# Patient Record
Sex: Female | Born: 1995
Health system: Southern US, Community
[De-identification: ages and names within clinical notes are randomized; demographics above are authoritative.]

## PROBLEM LIST (undated history)

## (undated) ENCOUNTER — Ambulatory Visit: Disposition: A | Discharge status: Still In Hospital | Payer: Medicaid Other

## (undated) ENCOUNTER — Ambulatory Visit: Admission: EM | Payer: BC Managed Care – PPO | Source: Home / Self Care

## (undated) DIAGNOSIS — T7840XA Allergy, unspecified, initial encounter: Secondary | ICD-10-CM

## (undated) DIAGNOSIS — D249 Benign neoplasm of unspecified breast: Secondary | ICD-10-CM

## (undated) HISTORY — DX: Benign neoplasm of unspecified breast: D24.9

## (undated) HISTORY — DX: Allergy, unspecified, initial encounter: T78.40XA

---
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
## 2013-05-19 ENCOUNTER — Ambulatory Visit (INDEPENDENT_AMBULATORY_CARE_PROVIDER_SITE_OTHER): Payer: Commercial Managed Care - PPO | Admitting: Physician Assistant

## 2013-05-19 VITALS — BP 120/79 | HR 114 | Temp 98.9°F | Resp 16 | Ht 68.0 in | Wt 177.0 lb

## 2013-05-19 DIAGNOSIS — A059 Bacterial foodborne intoxication, unspecified: Secondary | ICD-10-CM

## 2013-05-19 NOTE — Progress Notes (Signed)
  Subjective:    Patient ID: Ann Schultz, female    DOB: [birth year], 17 y.o.   MRN: [id number]  HPI    Ann Schultz is a pleasant 17 yr old female here because "I think I have food poisoning."  Yesterday pt ate at [facility] with her family.  On the way home felt very nauseous and subsequently vomited.  Tried eating bread and drinking ginger ale but could not keep anything down.  Last episode of vomiting was about 18 hours ago.  Has had two episodes of diarrhea, last about 12 hours ago.  Today has been able to tolerated saltine crackers, water, and ginger ale.  Feeling better than she was, but does feel achy.  Has some low back pain - denies urinary symptoms.  No abdominal pain.  No fever - but has felt hot and cold.  Pt is not currently nauseated.  She has been hydrating and able to urinate.  4 of 5 family members are also ill with similar symptoms.     Review of Systems  Constitutional: Negative for fever and chills.  Respiratory: Negative.   Cardiovascular: Negative.   Gastrointestinal: Positive for nausea, vomiting and diarrhea. Negative for abdominal pain.  Genitourinary: Negative for dysuria, urgency, frequency and flank pain.  Musculoskeletal: Positive for back pain.  Skin: Negative.   Neurological: Positive for headaches.       Objective:   Physical Exam  Vitals reviewed. Constitutional: She is oriented to person, place, and time. She appears well-developed and well-nourished. No distress.  HENT:  Head: Normocephalic and atraumatic.  Mouth/Throat: Mucous membranes are normal.  Eyes: Conjunctivae are normal. No scleral icterus.  Cardiovascular: Normal rate, regular rhythm and normal heart sounds.  Exam reveals no gallop and no friction rub.   No murmur heard. Pulmonary/Chest: Effort normal and breath sounds normal. She has no wheezes. She has no rales.  Abdominal: Soft. Bowel sounds are decreased. There is no tenderness. There is no rigidity, no rebound, no guarding, no CVA  tenderness, no tenderness at McBurney's point and negative Murphy's sign.  Neurological: She is alert and oriented to person, place, and time.  Skin: Skin is warm and dry.  Psychiatric: She has a normal mood and affect. Her behavior is normal.          Assessment & Plan:  Food poisoning   Ann Schultz is an 17 yr old female here with suspected food poisoning.  Pt experienced nausea, vomiting, diarrhea after eating at [facility].  Several family members ill with similar symptoms after eating at same restaurant.  Afebrile, VSS, no abd pain.  Pt feels like she is improving, no vomiting or diarrhea for 12+ hours.  Encouraged oral rehydration.  Advance diet as tolerated.  May use Tylenol/Motrin for aches.  Discussed RTC/ED precautions including fever, abd pain, worsening symptoms.  Pt understands and is in agreement with this plan.  School note provided for today and tomorrow.

## 2013-05-19 NOTE — Patient Instructions (Addendum)
Food Poisoning °Food poisoning is an illness caused by something you ate or drank. There are over 250 known causes of food poisoning. However, many other causes are unknown. You can be treated even if the exact cause of your food poisoning is not known. In most cases, food poisoning is mild and lasts 1 to 2 days. However, some cases can be serious, especially for people with low immune systems, the elderly, children and infants, and pregnant women. °CAUSES  °Poor personal hygiene, improper cleaning of storage and preparation areas, and unclean utensils can cause infection or tainting (contamination) of foods. The causes of food poisoning are numerous. Infectious agents, such as viruses, bacteria, or parasites, can cause harm by infecting the intestine and disrupting the absorption of nutrients and water. This can cause diarrhea and lead to dehydration. Viruses are responsible for most of the food poisonings in which an agent is found. Parasites are less likely to cause food poisoning. Toxic agents, such as poisonous mushrooms, marine algae, and pesticides can also cause food poisoning. °· Viral causes of food poisoning include: °· Norovirus. °· Rotavirus. °· Hepatitis A. °· Bacterial causes of food poisoning include: °· Salmonellae. °· Campylobacter. °· Bacillus cereus. °· Escherichia coli (E. coli). °· Shigella. °· Listeria monocytogenes. °· Clostridium botulinum (botulism). °· Vibrio cholerae. °· Parasites that can cause food poisoning include: °· Giardia. °· Cryptosporidium. °· Toxoplasma. °SYMPTOMS °Symptoms may appear several hours or longer after consuming the contaminated food or drink. Symptoms may include: °· Nausea. °· Vomiting. °· Cramping. °· Diarrhea. °· Fever and chills. °· Muscle aches. °DIAGNOSIS °Your caregiver may be able to diagnose food poisoning from a list of what you have recently eaten and results from lab tests. Diagnostic tests may include an exam of the feces. °TREATMENT °In most cases,  treatment focuses on helping to relieve your symptoms and staying well hydrated. Antibiotics are rarely needed. In severe cases, hospitalization may be required. °PREVENTION  °· Wash your hands, food preparation surfaces, and utensils thoroughly before and after handling raw foods. °· Keep refrigerated foods below 40° F (5° C). °· Serve hot foods immediately or keep them heated above 140° F (60° C). °· Divide large volumes of food into small portions for rapid cooling in the refrigerator. Hot, bulky foods in the refrigerator can raise the temperature of other foods that have already cooled. °· Follow approved canning procedures. °· Heat canned foods thoroughly before tasting. °· When in doubt, throw it out. °· Infants, the elderly, women who are pregnant, and people with compromised immune systems are especially susceptible to food poisoning. These people should never consume unpasteurized cheese, unpasteurized cider, raw fish, raw seafood, or raw meat type products. °HOME CARE INSTRUCTIONS  °· Drink enough water and fluids to keep your urine clear or pale yellow. Drink small amounts of fluids frequently and increase as tolerated. °· Ask your caregiver for specific rehydration instructions. °· Avoid: °· Foods high in sugar. °· Alcohol. °· Carbonated drinks. °· Tobacco. °· Juice. °· Caffeine drinks. °· Extremely hot or cold fluids. °· Fatty, greasy foods. °· Too much intake of anything at one time. °· Dairy products until 24 to 48 hours after diarrhea stops. °· You may consume probiotics. Probiotics are active cultures of beneficial bacteria. They may lessen the amount and number of diarrheal stools in adults. Probiotics can be found in yogurt with active cultures and in supplements. °· Wash your hands well to avoid spreading the bacteria. °· Only take over-the-counter or prescription medicines for pain, discomfort,   or fever as directed by your caregiver. Do not give aspirin to children. °· Ask your caregiver if you  should continue to take your regular prescribed and over-the-counter medicines. °SEEK IMMEDIATE MEDICAL CARE IF:  °· You have difficulty breathing, swallowing, talking, or moving. °· You develop blurred vision. °· You are unable to keep fluids down. °· You faint or nearly faint. °· Your eyes turn yellow. °· Vomiting or diarrhea develops or becomes persistent. °· Abdominal pain develops, increases, or localizes in one small area. °· You have a fever. °· The diarrhea becomes excessive or contains blood or mucus. °· You develop excessive weakness, dizziness, or extreme thirst. °· You have no urine for 8 hours. °MAKE SURE YOU:  °· Understand these instructions. °· Will watch your condition. °· Will get help right away if you are not doing well or get worse. °Document Released: 09/12/2004 Document Revised: 03/08/2012 Document Reviewed: 05/01/2011 °[employer]® Patient Information ©2014 [employer]. ° °

## 2014-09-17 ENCOUNTER — Ambulatory Visit (INDEPENDENT_AMBULATORY_CARE_PROVIDER_SITE_OTHER): Payer: BC Managed Care – PPO | Admitting: Family Medicine

## 2014-09-17 VITALS — BP 104/58 | HR 119 | Temp 98.9°F | Resp 18 | Ht 66.75 in | Wt 188.8 lb

## 2014-09-17 DIAGNOSIS — R109 Unspecified abdominal pain: Secondary | ICD-10-CM

## 2014-09-17 DIAGNOSIS — R300 Dysuria: Secondary | ICD-10-CM

## 2014-09-17 DIAGNOSIS — R350 Frequency of micturition: Secondary | ICD-10-CM

## 2014-09-17 LAB — POCT URINALYSIS DIPSTICK
Bilirubin, UA: NEGATIVE
KETONES UA: NEGATIVE
Nitrite, UA: POSITIVE
PROTEIN UA: 100
SPEC GRAV UA: 1.015
Urobilinogen, UA: 0.2
pH, UA: 6

## 2014-09-17 LAB — POCT UA - MICROSCOPIC ONLY
CRYSTALS, UR, HPF, POC: NEGATIVE
MUCUS UA: NEGATIVE
Yeast, UA: NEGATIVE

## 2014-09-17 MED ORDER — CIPROFLOXACIN HCL 500 MG PO TABS: 500.0000 mg | ORAL_TABLET | Freq: Two times a day (BID) | ORAL | Status: DC

## 2014-09-17 NOTE — Progress Notes (Signed)
     Results for orders placed in visit on 09/17/14  POCT UA - MICROSCOPIC ONLY      Result Value Ref Range   WBC, Ur, HPF, POC tntc     RBC, urine, microscopic 0-3     Bacteria, U Microscopic small     Mucus, UA neg     Epithelial cells, urine per micros 10-15     Crystals, Ur, HPF, POC neg     Casts, Ur, LPF, POC renal tubular     Yeast, UA neg    POCT URINALYSIS DIPSTICK      Result Value Ref Range   Color, UA yellow     Clarity, UA cloudy     Glucose, UA nbeg     Bilirubin, UA neg     Ketones, UA neg     Spec Grav, UA 1.015     Blood, UA large     pH, UA 6.0     Protein, UA 100     Urobilinogen, UA 0.2     Nitrite, UA positive     Leukocytes, UA large (3+)

## 2014-09-17 NOTE — Patient Instructions (Signed)

## 2014-09-17 NOTE — Progress Notes (Signed)
This chart was scribed for Dr. Robyn Haber, MD by Erling Conte, Medical Scribe. This patient was seen in Room 9 and the patient's care was started at 11:33 AM.   Patient ID: Ann Schultz MRN: [id number], DOB: 07-05-1996, 18 y.o. Date of Encounter: 09/17/2014, 11:32 AM  Primary Physician: Tawanna Solo, MD  Chief Complaint:  Chief Complaint  Patient presents with  . Chills    x 3-5 days  . Generalized Body Aches  . cloudy urine  . Dysuria  . Flank Pain    left side    HPI: 18 y.o. year old female presents with 3-5 day history of dysuria, myalgias, urgency, foul smelling urine, frequency and not voiding a lot of urine at one time. Pt states that her last UTI was several years ago, while she was in high school. Pt has not taken anything for her symptoms. Nothing seems to make her symptoms better. She denies any vaginal discharge, back pain, fever, nausea, hematuria, or emesis  Pt is a Ship broker, Civil engineer, contracting   Past Medical History  Diagnosis Date  . Breast fibroadenoma     right  . Allergy      Home Meds: Prior to Admission medications   Medication Sig Start Date End Date Taking? Authorizing Provider  ciprofloxacin (CIPRO) 500 MG tablet Take 1 tablet (500 mg total) by mouth 2 (two) times daily. 09/17/14   Robyn Haber, MD  medroxyPROGESTERone (DEPO-PROVERA) 150 MG/ML injection Inject 1 mL (150 mg total) into the muscle once. 08/05/12   Damien Fusi, NP    Allergies: No Known Allergies  History   Social History  . Marital Status: Single    Spouse Name: N/A    Number of Children: N/A  . Years of Education: N/A   Occupational History  . Not on file.   Social History Main Topics  . Smoking status: Never Smoker   . Smokeless tobacco: Not on file  . Alcohol Use: No  . Drug Use: No  . Sexual Activity: Yes   Other Topics Concern  . Not on file   Social History Narrative  . No narrative on file     Review of Systems: Constitutional:  negative for chills, fever, night sweats or weight changes Cardiovascular: negative for chest pain or palpitations Respiratory: negative for hemoptysis, wheezing, or shortness of breath Abdominal: negative for abdominal pain, nausea, vomiting or diarrhea Dermatological: negative for rash Neurologic: negative for headache GU: positive for flank pain, dysuria, frequency and decrease in urine output. Negative for vaginal discharge or hematuria Musculoskeletal: positive for myalgias A complete 10 system review of systems was obtained and all systems are negative except as noted   Physical Exam: Blood pressure 104/58, pulse 119, temperature 98.9 F (37.2 C), temperature source Oral, resp. rate 18, height 5' 6.75" (1.695 m), weight 188 lb 12.8 oz (85.639 kg), last menstrual period 08/17/2014, SpO2 100.00%., Body mass index is 29.81 kg/(m^2). General: Well developed, well nourished, in no acute distress. Head: Normocephalic, atraumatic, eyes without discharge, sclera non-icteric, nares are congested. Bilateral auditory canals clear, TM's are without perforation, pearly grey with reflective cone of light bilaterally. Serous effusion bilaterally behind TM's. Maxillary sinus TTP. Oral cavity moist, dentition normal. Posterior pharynx with post nasal drip and mild erythema. No peritonsillar abscess or tonsillar exudate. Neck: Supple. No thyromegaly. Full ROM. No lymphadenopathy. Lungs: Coarse breath sounds bilaterally without Clear bilaterally to auscultation without wheezes, rales, or rhonchi. Breathing is unlabored.  Heart: RRR with S1 S2.  No murmurs, rubs, or gallops appreciated. Abdomen: Soft, non-tender, non-distended with normoactive bowel sounds. No hepatosplenomegaly. No rebound/guarding. No obvious abdominal masses. McBurney's, Rovsing's, Iliopsoas, and table jar all negative. Msk:  Strength and tone normal for age. Extremities: No clubbing or cyanosis. No edema. Neuro: Alert and oriented X 3.  Moves all extremities spontaneously. CNII-XII grossly in tact. Psych:  Responds to questions appropriately with a normal affect.   Labs: Results for orders placed in visit on 09/17/14  POCT UA - MICROSCOPIC ONLY      Result Value Ref Range   WBC, Ur, HPF, POC tntc     RBC, urine, microscopic 0-3     Bacteria, U Microscopic small     Mucus, UA neg     Epithelial cells, urine per micros 10-15     Crystals, Ur, HPF, POC neg     Casts, Ur, LPF, POC renal tubular     Yeast, UA neg    POCT URINALYSIS DIPSTICK      Result Value Ref Range   Color, UA yellow     Clarity, UA cloudy     Glucose, UA nbeg     Bilirubin, UA neg     Ketones, UA neg     Spec Grav, UA 1.015     Blood, UA large     pH, UA 6.0     Protein, UA 100     Urobilinogen, UA 0.2     Nitrite, UA positive     Leukocytes, UA large (3+)       ASSESSMENT AND PLAN:  18 y.o. year old female with  1. Dysuria   2. Flank pain   3. Urinary frequency    Will prescribe pt with Cipro  Signed, Robyn Haber, MD 09/17/2014 11:32 AM

## 2014-09-18 ENCOUNTER — Telehealth: Payer: Self-pay

## 2014-09-18 NOTE — Telephone Encounter (Signed)
Patient's mother states that patient was supposed to return to school today however patient still does not feel well and was out of school today. She will also miss school tomorrow. Can the patient have an updated note excusing her for today and tomorrow? Also, wants to know what her daughter can take to help her feel better  Mahira Petras (mother) [phone]

## 2014-09-19 NOTE — Telephone Encounter (Signed)
LM for rtn call  Lab results are in- urine culture grew bacteria. Be sure pt is finishing abx.  Pt may use OTC AZO for pain relief. Ok to extend OOS note. Letter is written- needs to be printed.

## 2014-09-19 NOTE — Telephone Encounter (Signed)
Advised to make sure pt finishes antibiotics, advised they can purchase over the counter azo for pain and also that the out of school note was able to be extended until 09/21/14. Patient will pick up letter in the morning

## 2014-09-20 LAB — URINE CULTURE: Colony Count: >=100000

## 2014-11-09 ENCOUNTER — Ambulatory Visit: Payer: BC Managed Care – PPO

## 2015-04-06 ENCOUNTER — Ambulatory Visit (INDEPENDENT_AMBULATORY_CARE_PROVIDER_SITE_OTHER): Payer: 59 | Admitting: Family Medicine

## 2015-04-06 VITALS — BP 110/68 | HR 77 | Temp 98.3°F | Resp 18 | Ht 69.0 in | Wt 179.0 lb

## 2015-04-06 DIAGNOSIS — S0502XA Injury of conjunctiva and corneal abrasion without foreign body, left eye, initial encounter: Secondary | ICD-10-CM | POA: Diagnosis not present

## 2015-04-06 MED ORDER — ERYTHROMYCIN 5 MG/GM OP OINT: TOPICAL_OINTMENT | OPHTHALMIC | Status: DC

## 2015-04-06 NOTE — Patient Instructions (Signed)
Use the antibiotic ointment in your left eye at least 4x (up to 6x) a day until sx resolve.  If your eye is not improved tomorrow please come back in- in that case we may need to send you to an eye doctor.   You can also use ibuprofen as needed for pain

## 2015-04-06 NOTE — Progress Notes (Signed)
[facility] [address] [place] [address] [phone]  Date:  04/06/2015   Name:  Ann Schultz   DOB:  07-21-1996   MRN:  [id number]  PCP:  Tawanna Solo, MD    Chief Complaint: Eye Pain and Headache   History of Present Illness:  Ann Schultz is a 19 y.o. very pleasant female patient who presents with the following:  Yesterday she noted pain in the lateral part of her left eye. She used a cold compress and felt better, but then the pain came back last night She does not have any crusting or stickiness of her eye.  Hast not noted any discharge Her vision seems to be ok, but the eye is still painful today No photophobia She wears readers but no rx lenses, no contacts used.   No ST, no cough, no fever She is not aware of any trauma to the eye.   She is otherwise healthy and feeling well today  Patient Active Problem List   Diagnosis Date Noted  . Breast mass, right, probable fibroadenoma 11/10/2011    Past Medical History  Diagnosis Date  . Breast fibroadenoma     right  . Allergy     History reviewed. No pertinent past surgical history.  History  Substance Use Topics  . Smoking status: Never Smoker   . Smokeless tobacco: Not on file  . Alcohol Use: No    Family History  Problem Relation Age of Onset  . Cancer Maternal Grandmother     breast    No Known Allergies  Medication list has been reviewed and updated.  Current Outpatient Prescriptions on File Prior to Visit  Medication Sig Dispense Refill  . ciprofloxacin (CIPRO) 500 MG tablet Take 1 tablet (500 mg total) by mouth 2 (two) times daily. (Patient not taking: Reported on 04/06/2015) 6 tablet 0  . medroxyPROGESTERone (DEPO-PROVERA) 150 MG/ML injection Inject 1 mL (150 mg total) into the muscle once. (Patient not taking: Reported on 04/06/2015) 1 mL 4   No current facility-administered medications on file prior to visit.    Review of Systems:  As per HPI- otherwise  negative.   Physical Examination: Filed Vitals:   04/06/15 1056  BP: 110/68  Pulse: 77  Temp: 98.3 F (36.8 C)  Resp: 18   Filed Vitals:   04/06/15 1056  Height: 5\' 9"  (1.753 m)  Weight: 179 lb (81.194 kg)   Body mass index is 26.42 kg/(m^2). Ideal Body Weight: Weight in (lb) to have BMI = 25: 168.9  GEN: WDWN, NAD, Non-toxic, A & O x 3, mild overweight, looks well HEENT: Atraumatic, Normocephalic. Neck supple. No masses, No LAD.  Bilateral TM wnl, oropharynx normal.  PEERL,EOMI.   Ears and Nose: No external deformity. CV: RRR, No M/G/R. No JVD. No thrill. No extra heart sounds. PULM: CTA B, no wheezes, crackles, rhonchi. No retractions. No resp. distress. No accessory muscle use. ABD: S, NT, ND, +BS. No rebound. No HSM. EXTR: No c/c/e NEURO Normal gait.  PSYCH: Normally interactive. Conversant. Not depressed or anxious appearing.  Calm demeanor.  There is injection of the lateral left cornea. Normal eye motion, fundoscopic exam wnl Fluorescin stain: irritation but no acute ulcer of the temporal aspect of the left cornea No FB. No stye noted  Assessment and Plan: Corneal abrasion, left, initial encounter - Plan: erythromycin ophthalmic ointment  Discussed in detail with pt.  Offered to refer to ortho today vs trial of abx  ointment and recheck tomorrow if not better  She would prefer trial of treatment and will follow-up if not better Signed Lamar Blinks, MD

## 2015-04-12 ENCOUNTER — Ambulatory Visit (INDEPENDENT_AMBULATORY_CARE_PROVIDER_SITE_OTHER): Payer: 59 | Admitting: Family Medicine

## 2015-04-12 VITALS — BP 124/60 | HR 98 | Temp 100.0°F | Ht 67.0 in | Wt 177.4 lb

## 2015-04-12 DIAGNOSIS — R5382 Chronic fatigue, unspecified: Secondary | ICD-10-CM | POA: Diagnosis not present

## 2015-04-12 DIAGNOSIS — R6889 Other general symptoms and signs: Secondary | ICD-10-CM | POA: Diagnosis not present

## 2015-04-12 DIAGNOSIS — D649 Anemia, unspecified: Secondary | ICD-10-CM

## 2015-04-12 LAB — POCT CBC
GRANULOCYTE PERCENT: 60.4 % (ref 37–80)
HEMATOCRIT: 35.1 % — AB (ref 37.7–47.9)
Hemoglobin: 10.8 g/dL — AB (ref 12.2–16.2)
LYMPH, POC: 1.7 (ref 0.6–3.4)
MCH, POC: 23.1 pg — AB (ref 27–31.2)
MCHC: 30.8 g/dL — AB (ref 31.8–35.4)
MCV: 75.2 fL — AB (ref 80–97)
MID (cbc): 0.7 (ref 0–0.9)
MPV: 8.5 fL (ref 0–99.8)
PLATELET COUNT, POC: 254 10*3/uL (ref 142–424)
POC GRANULOCYTE: 3.6 (ref 2–6.9)
POC LYMPH %: 28.6 % (ref 10–50)
POC MID %: 11 % (ref 0–12)
RBC: 4.67 M/uL (ref 4.04–5.48)
RDW, POC: 16.7 %
WBC: 6 10*3/uL (ref 4.6–10.2)

## 2015-04-12 LAB — POCT INFLUENZA A/B
Influenza A, POC: NEGATIVE
Influenza B, POC: POSITIVE

## 2015-04-12 MED ORDER — OSELTAMIVIR PHOSPHATE 75 MG PO CAPS: 75.0000 mg | ORAL_CAPSULE | Freq: Two times a day (BID) | ORAL | Status: DC

## 2015-04-12 MED ORDER — FERROUS SULFATE 325 (65 FE) MG PO TABS: 325.0000 mg | ORAL_TABLET | Freq: Every day | ORAL | Status: DC

## 2015-04-12 NOTE — Progress Notes (Addendum)
Ann Schultz is a 19 y.o. female who presents to Urgent Care today with complaints of chills, URI sx's:  1.  URI: Son diagnosed with swab + flu last Wednesday.  Treated with Tamiflu.  Started recovering about 2 days ago.  She began having issues about 2 days ago.  Alternating fevers and chills at home.  With rhinorrhea, cough non-productive.  Nausea and anorexia.  Malaise.  Received flu shot in August.   2.  Chronic fatigue:   Chronic for about 2 years.  Had Hgb 7.3 after she gave birth to her son, never rechedcked.  Started on about 6 months of daily iron without recheck.  States she is chronically chilled and fatigued.  No LE edema.  No family history of thyroid issues.  History of menorrhagia x 2 years or so.  Even when on OCPs.  PMH reviewed.  Past Medical History  Diagnosis Date  . Breast fibroadenoma     right  . Allergy    History reviewed. No pertinent past surgical history.  Medications reviewed. Current Outpatient Prescriptions  Medication Sig Dispense Refill  . erythromycin ophthalmic ointment Apply a 1cm ribbon to lower lid of left eye up to 6x a day for one week 3.5 g 0   No current facility-administered medications for this visit.    ROS as above otherwise neg.    Physical Exam:  BP 124/60 mmHg  Pulse 98  Temp(Src) 100 F (37.8 C) (Oral)  Ht 5\' 7"  (1.702 m)  Wt 177 lb 6 oz (80.457 kg)  BMI 27.77 kg/m2  SpO2 100%  LMP 04/02/2015 Gen:  Patient sitting on exam table, appears stated age in no acute distress Head: Normocephalic atraumatic Eyes: EOMI, PERRL, sclera and conjunctiva non-erythematous.  Some mild pallor noted.  Ears:  Canals clear bilaterally.  TMs pearly gray bilaterally without erythema or bulging.   Nose:  Nasal turbinates grossly enlarged bilaterally. Some exudates noted, clear. Neck: No cervical lymphadenopathy noted Heart:  RRR, no murmurs auscultated. Pulm:  Clear to auscultation bilaterally with good air movement.  No wheezes or rales noted.     Results for orders placed or performed in visit on 04/12/15  POCT CBC  Result Value Ref Range   WBC 6.0 4.6 - 10.2 K/uL   Lymph, poc 1.7 0.6 - 3.4   POC LYMPH PERCENT 28.6 10 - 50 %L   MID (cbc) 0.7 0 - 0.9   POC MID % 11.0 0 - 12 %M   POC Granulocyte 3.6 2 - 6.9   Granulocyte percent 60.4 37 - 80 %G   RBC 4.67 4.04 - 5.48 M/uL   Hemoglobin 10.8 (A) 12.2 - 16.2 g/dL   HCT, POC 35.1 (A) 37.7 - 47.9 %   MCV 75.2 (A) 80 - 97 fL   MCH, POC 23.1 (A) 27 - 31.2 pg   MCHC 30.8 (A) 31.8 - 35.4 g/dL   RDW, POC 16.7 %   Platelet Count, POC 254 142 - 424 K/uL   MPV 8.5 0 - 99.8 fL  POCT Influenza A/B  Result Value Ref Range   Influenza A, POC Negative    Influenza B, POC Positive      Assessment and Plan: 1.  Influenza: - Tamiflu to treat.  She is within the window - out of work for tomorrow and Saturday. - to return if no improvement by Monday, sooner if worsening  2.  Anemia: - likely iron deficiency from chronic menorrhagia. - FU with gyn.  -  Oral iron supplementation.

## 2015-04-12 NOTE — Patient Instructions (Signed)
Take the Tamiflu x 5 days, twice a day.  Start taking the iron pills once a day.  If you can tolerate twice a day, this is ideal.  Come back on Monday if you're not doing any better.  Come back sooner if you're worsening.

## 2015-04-20 ENCOUNTER — Other Ambulatory Visit: Payer: Self-pay | Admitting: Obstetrics and Gynecology

## 2015-04-20 DIAGNOSIS — IMO0002 Reserved for concepts with insufficient information to code with codable children: Secondary | ICD-10-CM

## 2015-04-20 DIAGNOSIS — R229 Localized swelling, mass and lump, unspecified: Principal | ICD-10-CM

## 2015-04-23 ENCOUNTER — Ambulatory Visit
Admission: RE | Admit: 2015-04-23 | Discharge: 2015-04-23 | Disposition: A | Discharge status: Home / Self Care | Payer: 59 | Source: Ambulatory Visit | Attending: Obstetrics and Gynecology | Admitting: Obstetrics and Gynecology

## 2015-04-23 DIAGNOSIS — IMO0002 Reserved for concepts with insufficient information to code with codable children: Secondary | ICD-10-CM

## 2015-04-23 DIAGNOSIS — R229 Localized swelling, mass and lump, unspecified: Principal | ICD-10-CM

## 2015-05-29 ENCOUNTER — Encounter (HOSPITAL_COMMUNITY): Payer: Self-pay | Admitting: *Deleted

## 2015-05-29 ENCOUNTER — Other Ambulatory Visit: Payer: Self-pay | Admitting: General Surgery

## 2015-05-31 ENCOUNTER — Ambulatory Visit (HOSPITAL_COMMUNITY)
Admission: RE | Admit: 2015-05-31 | Discharge: 2015-05-31 | Disposition: A | Discharge status: Home / Self Care | Payer: 59 | Source: Ambulatory Visit | Attending: General Surgery | Admitting: General Surgery

## 2015-05-31 ENCOUNTER — Ambulatory Visit (HOSPITAL_COMMUNITY): Payer: 59 | Admitting: Anesthesiology

## 2015-05-31 ENCOUNTER — Encounter (HOSPITAL_COMMUNITY): Payer: Self-pay | Admitting: General Surgery

## 2015-05-31 ENCOUNTER — Encounter (HOSPITAL_COMMUNITY)
Admission: RE | Disposition: A | Discharge status: Home / Self Care | Payer: Self-pay | Source: Ambulatory Visit | Attending: General Surgery

## 2015-05-31 DIAGNOSIS — Z803 Family history of malignant neoplasm of breast: Secondary | ICD-10-CM | POA: Diagnosis not present

## 2015-05-31 DIAGNOSIS — N63 Unspecified lump in breast: Secondary | ICD-10-CM | POA: Diagnosis present

## 2015-05-31 DIAGNOSIS — D242 Benign neoplasm of left breast: Secondary | ICD-10-CM | POA: Insufficient documentation

## 2015-05-31 HISTORY — PX: EXCISION OF BREAST BIOPSY: SHX5822

## 2015-05-31 LAB — CBC
HCT: 30.1 % — ABNORMAL LOW (ref 36.0–46.0)
Hemoglobin: 9.6 g/dL — ABNORMAL LOW (ref 12.0–15.0)
MCH: 23.1 pg — AB (ref 26.0–34.0)
MCHC: 31.9 g/dL (ref 30.0–36.0)
MCV: 72.5 fL — AB (ref 78.0–100.0)
Platelets: 308 10*3/uL (ref 150–400)
RBC: 4.15 MIL/uL (ref 3.87–5.11)
RDW: 16.2 % — AB (ref 11.5–15.5)
WBC: 5.9 10*3/uL (ref 4.0–10.5)

## 2015-05-31 LAB — HCG, SERUM, QUALITATIVE: Preg, Serum: NEGATIVE

## 2015-05-31 SURGERY — EXCISION OF BREAST BIOPSY
Anesthesia: General | Site: Breast | Laterality: Left

## 2015-05-31 MED ORDER — 0.9 % SODIUM CHLORIDE (POUR BTL) OPTIME
TOPICAL | Status: DC | PRN
  Administered 2015-05-31: 1000 mL

## 2015-05-31 MED ORDER — ACETAMINOPHEN 650 MG RE SUPP
650.0000 mg | RECTAL | Status: DC | PRN
  Filled 2015-05-31: qty 1

## 2015-05-31 MED ORDER — FENTANYL CITRATE (PF) 250 MCG/5ML IJ SOLN
INTRAMUSCULAR | Status: AC
  Filled 2015-05-31: qty 5

## 2015-05-31 MED ORDER — SODIUM CHLORIDE 0.9 % IJ SOLN: 3.0000 mL | Freq: Two times a day (BID) | INTRAMUSCULAR | Status: DC

## 2015-05-31 MED ORDER — PROMETHAZINE HCL 25 MG/ML IJ SOLN
6.2500 mg | INTRAMUSCULAR | Status: DC | PRN
Start: 2015-05-31 — End: 2015-05-31

## 2015-05-31 MED ORDER — MIDAZOLAM HCL 2 MG/2ML IJ SOLN
INTRAMUSCULAR | Status: AC
  Filled 2015-05-31: qty 2

## 2015-05-31 MED ORDER — FENTANYL CITRATE (PF) 100 MCG/2ML IJ SOLN
INTRAMUSCULAR | Status: AC
  Filled 2015-05-31: qty 2

## 2015-05-31 MED ORDER — ONDANSETRON HCL 4 MG/2ML IJ SOLN
INTRAMUSCULAR | Status: DC | PRN
  Administered 2015-05-31: 4 mg via INTRAVENOUS

## 2015-05-31 MED ORDER — BUPIVACAINE-EPINEPHRINE 0.25% -1:200000 IJ SOLN
INTRAMUSCULAR | Status: DC | PRN
  Administered 2015-05-31: 10 mL

## 2015-05-31 MED ORDER — DEXAMETHASONE SODIUM PHOSPHATE 10 MG/ML IJ SOLN
INTRAMUSCULAR | Status: AC
  Filled 2015-05-31: qty 1

## 2015-05-31 MED ORDER — MIDAZOLAM HCL 5 MG/5ML IJ SOLN
INTRAMUSCULAR | Status: DC | PRN
  Administered 2015-05-31: 2 mg via INTRAVENOUS

## 2015-05-31 MED ORDER — FENTANYL CITRATE (PF) 100 MCG/2ML IJ SOLN
INTRAMUSCULAR | Status: DC | PRN
  Administered 2015-05-31 (×3): 25 ug via INTRAVENOUS

## 2015-05-31 MED ORDER — ACETAMINOPHEN 10 MG/ML IV SOLN
1000.0000 mg | Freq: Once | INTRAVENOUS | Status: DC
  Filled 2015-05-31: qty 100

## 2015-05-31 MED ORDER — LIDOCAINE HCL (CARDIAC) 10 MG/ML IV SOLN
INTRAVENOUS | Status: DC | PRN
  Administered 2015-05-31: 80 mg via INTRAVENOUS

## 2015-05-31 MED ORDER — LACTATED RINGERS IV SOLN
INTRAVENOUS | Status: DC
  Administered 2015-05-31: 1000 mL via INTRAVENOUS

## 2015-05-31 MED ORDER — BUPIVACAINE-EPINEPHRINE (PF) 0.25% -1:200000 IJ SOLN
INTRAMUSCULAR | Status: AC
  Filled 2015-05-31: qty 30

## 2015-05-31 MED ORDER — CEFAZOLIN SODIUM-DEXTROSE 2-3 GM-% IV SOLR
2.0000 g | INTRAVENOUS | Status: AC
  Administered 2015-05-31: 2 g via INTRAVENOUS

## 2015-05-31 MED ORDER — OXYCODONE-ACETAMINOPHEN 5-325 MG PO TABS: 1.0000 | ORAL_TABLET | ORAL | Status: DC | PRN

## 2015-05-31 MED ORDER — LIDOCAINE HCL 1 % IJ SOLN
INTRAMUSCULAR | Status: AC
  Filled 2015-05-31: qty 20

## 2015-05-31 MED ORDER — SODIUM CHLORIDE 0.9 % IV SOLN: 250.0000 mL | INTRAVENOUS | Status: DC | PRN

## 2015-05-31 MED ORDER — OXYCODONE HCL 5 MG PO TABS: 5.0000 mg | ORAL_TABLET | ORAL | Status: DC | PRN

## 2015-05-31 MED ORDER — FENTANYL CITRATE (PF) 100 MCG/2ML IJ SOLN
25.0000 ug | INTRAMUSCULAR | Status: DC | PRN
  Administered 2015-05-31 (×2): 25 ug via INTRAVENOUS

## 2015-05-31 MED ORDER — PROPOFOL 10 MG/ML IV BOLUS
INTRAVENOUS | Status: DC | PRN
  Administered 2015-05-31: 200 mg via INTRAVENOUS

## 2015-05-31 MED ORDER — PROPOFOL 10 MG/ML IV BOLUS
INTRAVENOUS | Status: AC
  Filled 2015-05-31: qty 20

## 2015-05-31 MED ORDER — SODIUM CHLORIDE 0.9 % IJ SOLN: 3.0000 mL | INTRAMUSCULAR | Status: DC | PRN

## 2015-05-31 MED ORDER — DEXAMETHASONE SODIUM PHOSPHATE 4 MG/ML IJ SOLN
INTRAMUSCULAR | Status: DC | PRN
  Administered 2015-05-31: 10 mg via INTRAVENOUS

## 2015-05-31 MED ORDER — LIDOCAINE HCL (PF) 1 % IJ SOLN
INTRAMUSCULAR | Status: DC | PRN
  Administered 2015-05-31: 10 mL

## 2015-05-31 MED ORDER — CEFAZOLIN SODIUM-DEXTROSE 2-3 GM-% IV SOLR
INTRAVENOUS | Status: AC
  Filled 2015-05-31: qty 50

## 2015-05-31 MED ORDER — LIDOCAINE HCL (CARDIAC) 20 MG/ML IV SOLN
INTRAVENOUS | Status: AC
  Filled 2015-05-31: qty 5

## 2015-05-31 MED ORDER — ONDANSETRON HCL 4 MG/2ML IJ SOLN
INTRAMUSCULAR | Status: AC
  Filled 2015-05-31: qty 2

## 2015-05-31 MED ORDER — ACETAMINOPHEN 325 MG PO TABS: 650.0000 mg | ORAL_TABLET | ORAL | Status: DC | PRN

## 2015-05-31 MED ORDER — ACETAMINOPHEN 10 MG/ML IV SOLN
INTRAVENOUS | Status: DC | PRN
  Administered 2015-05-31: 1000 mg via INTRAVENOUS

## 2015-05-31 SURGICAL SUPPLY — 39 items
ADH SKN CLS APL DERMABOND .7 (GAUZE/BANDAGES/DRESSINGS) ×1
BINDER BREAST LRG (GAUZE/BANDAGES/DRESSINGS) ×1 IMPLANT
BINDER BREAST XLRG (GAUZE/BANDAGES/DRESSINGS) IMPLANT
BLADE SURG SZ10 CARB STEEL (BLADE) ×2 IMPLANT
CHLORAPREP W/TINT 26ML (MISCELLANEOUS) ×2 IMPLANT
DECANTER SPIKE VIAL GLASS SM (MISCELLANEOUS) ×2 IMPLANT
DERMABOND ADVANCED (GAUZE/BANDAGES/DRESSINGS) ×1
DERMABOND ADVANCED .7 DNX12 (GAUZE/BANDAGES/DRESSINGS) IMPLANT
DRAIN CHANNEL 19F RND (DRAIN) IMPLANT
DRAPE UTILITY XL STRL (DRAPES) ×2 IMPLANT
DRSG PAD ABDOMINAL 8X10 ST (GAUZE/BANDAGES/DRESSINGS) ×1 IMPLANT
ELECT REM PT RETURN 9FT ADLT (ELECTROSURGICAL) ×2
ELECTRODE REM PT RTRN 9FT ADLT (ELECTROSURGICAL) ×1 IMPLANT
EVACUATOR SILICONE 100CC (DRAIN) IMPLANT
GAUZE SPONGE 4X4 12PLY STRL (GAUZE/BANDAGES/DRESSINGS) ×2 IMPLANT
GLOVE BIO SURGEON STRL SZ 6 (GLOVE) ×2 IMPLANT
GLOVE INDICATOR 6.5 STRL GRN (GLOVE) ×2 IMPLANT
GOWN STRL REUS W/ TWL XL LVL3 (GOWN DISPOSABLE) ×3 IMPLANT
GOWN STRL REUS W/TWL XL LVL3 (GOWN DISPOSABLE) ×2
KIT BASIN OR (CUSTOM PROCEDURE TRAY) ×2 IMPLANT
LIQUID BAND (GAUZE/BANDAGES/DRESSINGS) IMPLANT
MARKER SKIN DUAL TIP RULER LAB (MISCELLANEOUS) IMPLANT
NDL SPNL 22GX3.5 QUINCKE BK (NEEDLE) ×1 IMPLANT
NEEDLE HYPO 22GX1.5 SAFETY (NEEDLE) IMPLANT
NEEDLE SPNL 22GX3.5 QUINCKE BK (NEEDLE) ×2 IMPLANT
PACK GENERAL/GYN (CUSTOM PROCEDURE TRAY) ×2 IMPLANT
PACK UNIVERSAL I (CUSTOM PROCEDURE TRAY) ×2 IMPLANT
SPONGE LAP 18X18 X RAY DECT (DISPOSABLE) IMPLANT
STAPLER VISISTAT 35W (STAPLE) ×2 IMPLANT
STOCKINETTE 8 INCH (MISCELLANEOUS) IMPLANT
STRIP CLOSURE SKIN 1/2X4 (GAUZE/BANDAGES/DRESSINGS) ×1 IMPLANT
SUT MNCRL AB 4-0 PS2 18 (SUTURE) ×1 IMPLANT
SUT SILK 2 0 SH (SUTURE) ×1 IMPLANT
SUT VIC AB 3-0 SH 27 (SUTURE) ×2
SUT VIC AB 3-0 SH 27X BRD (SUTURE) IMPLANT
SUT VIC AB 3-0 SH 27XBRD (SUTURE) IMPLANT
SYR CONTROL 10ML LL (SYRINGE) ×2 IMPLANT
TOWEL OR 17X26 10 PK STRL BLUE (TOWEL DISPOSABLE) ×2 IMPLANT
TOWEL OR NON WOVEN STRL DISP B (DISPOSABLE) ×2 IMPLANT

## 2015-05-31 NOTE — H&P (Signed)
Ann Schultz 05/29/2015 8:52 AM Location: [facility] Patient #: [id number] DOB: 06-11-1996 Single / Language: Ann Schultz / Race: Black or African American Female  History of Present Illness Stark Klein MD; 05/29/2015 9:27 AM) Patient words: left breast.  The patient is a 19 year old female who presents with a breast mass. Patient is a 19 year old female well known to me with a new left breast mass. She is referred for consultation by Dr. Kathyrn Lass for a new left breast mass. She presents with a palpable breast mass for several months on the left. It has become painful most the time. It was originally only painful with her periods. 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 She underwent ultrasonography last month which demonstrated a 3.5 cm mass in the retroareolar left breast at 2:30. Because of the enlarging nature of the mass and pain, the patient desired to have this excised. The patient does have a grandmother that had breast cancer and had a mastectomy.   Other Problems [name], LPN; 7/56/4332 9:51 AM) Lump In Breast  Diagnostic Studies History [name], LPN; 8/84/1660 6:30 AM) Colonoscopy never Mammogram never Pap Smear 1-5 years ago  Allergies [name], LPN; 1/60/1093 2:35 AM) No Known Drug Allergies05/31/2016  Medication History [name], LPN; 5/73/2202 5:42 AM) Medications Reconciled  Social History [name], LPN; 07/04/2375 2:83 AM) Caffeine use Carbonated beverages, Tea. No alcohol use No drug use Tobacco use Never smoker.  Family History [name], LPN; 1/51/7616 0:73 AM) First Degree Relatives No pertinent family history  Pregnancy / Birth History [name], LPN; 07/07/6268 4:85 AM) Age at menarche 68 years. Gravida 1 Maternal age 3-20 Para 1 Regular periods  Review of Systems Claiborne Billings Dockery LPN; 4/62/7035 0:09 AM) General Not Present- Appetite Loss, Chills,  Fatigue, Fever, Night Sweats, Weight Gain and Weight Loss. Skin Not Present- Change in Wart/Mole, Dryness, Hives, Jaundice, New Lesions, Non-Healing Wounds, Rash and Ulcer. HEENT Not Present- Earache, Hearing Loss, Hoarseness, Nose Bleed, Oral Ulcers, Ringing in the Ears, Seasonal Allergies, Sinus Pain, Sore Throat, Visual Disturbances, Wears glasses/contact lenses and Yellow Eyes. Breast Present- Breast Mass and Breast Pain. Not Present- Nipple Discharge and Skin Changes. Cardiovascular Present- Chest Pain. Not Present- Difficulty Breathing Lying Down, Leg Cramps, Palpitations, Rapid Heart Rate, Shortness of Breath and Swelling of Extremities. Gastrointestinal Not Present- Abdominal Pain, Bloating, Bloody Stool, Change in Bowel Habits, Chronic diarrhea, Constipation, Difficulty Swallowing, Excessive gas, Gets full quickly at meals, Hemorrhoids, Indigestion, Nausea, Rectal Pain and Vomiting. Female Genitourinary Not Present- Frequency, Nocturia, Painful Urination, Pelvic Pain and Urgency. Musculoskeletal Not Present- Back Pain, Joint Pain, Joint Stiffness, Muscle Pain, Muscle Weakness and Swelling of Extremities. Neurological Not Present- Decreased Memory, Fainting, Headaches, Numbness, Seizures, Tingling, Tremor, Trouble walking and Weakness. Psychiatric Not Present- Anxiety, Bipolar, Change in Sleep Pattern, Depression, Fearful and Frequent crying. Endocrine Not Present- Cold Intolerance, Excessive Hunger, Hair Changes, Heat Intolerance, Hot flashes and New Diabetes. Hematology Not Present- Easy Bruising, Excessive bleeding, Gland problems, HIV and Persistent Infections.   Vitals Claiborne Billings Dockery LPN; 3/81/8299 3:71 AM) 05/29/2015 8:52 AM Weight: 181 lb Height: 67in Body Surface Area: 1.97 m Body Mass Index: 28.35 kg/m Temp.: 98.39F(Oral)  Pulse: 80 (Regular)  BP: 120/76 (Sitting, Left Arm, Standard)    Physical Exam Stark Klein MD; 05/29/2015 9:26 AM) General Mental  Status-Alert. General Appearance-Consistent with stated age. Hydration-Well hydrated. Voice-Normal.  Head and Neck Head-normocephalic, atraumatic with no lesions or palpable masses. Trachea-midline. Thyroid Gland Characteristics -  normal size and consistency.  Eye Eyeball - Bilateral-Extraocular movements intact. Sclera/Conjunctiva - Bilateral-No scleral icterus.  Chest and Lung Exam Chest and lung exam reveals -quiet, even and easy respiratory effort with no use of accessory muscles and on auscultation, normal breath sounds, no adventitious sounds and normal vocal resonance. Inspection Chest Wall - Normal. Back - normal.  Breast Note: Left breast demonstrates a 3 cm mass in the upper outer quadrant that is just below the border of the nipple. This is mobile and rubbery. She has no other palpable breast masses on either breast. She has no lymphadenopathy. Nipples appear normal with no discharge. There is no evidence of skin dimpling. Right breast scars are well-healed.   Cardiovascular Cardiovascular examination reveals -normal heart sounds, regular rate and rhythm with no murmurs and normal pedal pulses bilaterally.  Abdomen Inspection Inspection of the abdomen reveals - No Hernias. Palpation/Percussion Palpation and Percussion of the abdomen reveal - Soft, Non Tender, No Rebound tenderness, No Rigidity (guarding) and No hepatosplenomegaly. Auscultation Auscultation of the abdomen reveals - Bowel sounds normal.  Neurologic Neurologic evaluation reveals -alert and oriented x 3 with no impairment of recent or remote memory. Mental Status-Normal.  Musculoskeletal Global Assessment -Note: no gross deformities.  Normal Exam - Left-Upper Extremity Strength Normal and Lower Extremity Strength Normal. Normal Exam - Right-Upper Extremity Strength Normal and Lower Extremity Strength Normal.  Lymphatic Head & Neck  General Head & Neck  Lymphatics: Bilateral - Description - Normal. Axillary  General Axillary Region: Bilateral - Description - Normal. Tenderness - Non Tender. Femoral & Inguinal  Generalized Femoral & Inguinal Lymphatics: Bilateral - Description - No Generalized lymphadenopathy.    Assessment & Plan Stark Klein MD; 05/29/2015 9:26 AM) LEFT BREAST MASS (611.72  N63) Impression: The patient has a new symptomatic left breast mass. 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 We will plan to excise this. I discussed this with the patient and her family.  The surgical procedure was described to the patient. I discussed the incision type and location.  The risks and benefits of the procedure were described to the patient and she wishes to proceed.  We discussed the risks bleeding, infection, damage to other structures, need for further procedures/surgeries. We discussed the risk of seroma. The patient was advised if the area in the breast in cancer, we may need to go back to surgery for additional tissue to obtain negative margins or for a lymph node biopsy. The patient was advised that these are the most common complications, but that others can occur as well. They were advised against taking aspirin or other anti-inflammatory agents/blood thinners the week before surgery. Current Plans  Pt Education - [facility] Breast Biopsy Instructions (FLB): discussed with patient and provided information. Schedule for Surgery   Signed by Stark Klein, MD (05/29/2015 9:28 AM)

## 2015-05-31 NOTE — Op Note (Signed)
Excisional Breast Biopsy  Indications: This patient presents with a painful, enlarging left breast mass, clinically fibroadenoma  Pre-operative Diagnosis: left breast mass  Post-operative Diagnosis: left breast mass  Surgeon: Stark Klein   Anesthesia: General LMA anesthesia  ASA Class: 2  Procedure Details  The patient was seen in the Holding Room. The risks, benefits, complications, treatment options, and expected outcomes were discussed with the patient. The possibilities of reaction to medication, pulmonary aspiration, bleeding, infection, the need for additional procedures, failure to diagnose a condition, and creating a complication requiring transfusion or operation were discussed with the patient. The patient concurred with the proposed plan, giving informed consent.  The site of surgery properly noted/marked. The patient was taken to Operating Room # 11, identified, and the procedure verified as Breast Excisional Biopsy. A Time Out was held and the above information confirmed.  After induction of anesthesia, the left  breast and chest were prepped and draped in standard fashion. The lumpectomy was performed by creating a circumareolar incision over the upper outer quadrant of the breast.  Dissection was carried down around the mass with cautery.  The specimen was marked with sutures. Hemostasis was achieved with cautery.  The wound was irrigated and closed with a 3-0 Vicryl interrupted deep dermal stitch and a 4-0 Monocryl subcuticular closure in layers.    Sterile dressings were applied. At the end of the operation, all sponge, instrument, and needle counts were correct.  Findings: grossly clear surgical margins and rubbery mass consistent with fibroadenoma  Estimated Blood Loss:  Minimal           Specimens: left breast mass         Complications:  None; patient tolerated the procedure well.         Disposition: PACU - hemodynamically stable.         Condition: stable

## 2015-05-31 NOTE — Anesthesia Postprocedure Evaluation (Signed)
  Anesthesia Post-op Note  Patient: Ann Schultz  Procedure(s) Performed: Procedure(s) (LRB): LEFT EXCISIONAL BREAST BIOPSY (Left)  Patient Location: PACU  Anesthesia Type: General  Level of Consciousness: awake and alert   Airway and Oxygen Therapy: Patient Spontanous Breathing  Post-op Pain: mild  Post-op Assessment: Post-op Vital signs reviewed, Patient's Cardiovascular Status Stable, Respiratory Function Stable, Patent Airway and No signs of Nausea or vomiting  Last Vitals:  Filed Vitals:   05/31/15 1657  BP: 106/53  Pulse: 78  Temp: 36.4 C  Resp: 16    Post-op Vital Signs: stable   Complications: No apparent anesthesia complications

## 2015-05-31 NOTE — Discharge Instructions (Signed)
[facility] °Office Phone Number [phone] ° °BREAST BIOPSY/ PARTIAL MASTECTOMY: POST OP INSTRUCTIONS ° °Always review your discharge instruction sheet given to you by the facility where your surgery was performed. ° °IF YOU HAVE DISABILITY OR FAMILY LEAVE FORMS, YOU MUST BRING THEM TO THE OFFICE FOR PROCESSING.  DO NOT GIVE THEM TO YOUR DOCTOR. ° °1. A prescription for pain medication may be given to you upon discharge.  Take your pain medication as prescribed, if needed.  If narcotic pain medicine is not needed, then you may take acetaminophen (Tylenol) or ibuprofen (Advil) as needed. °2. Take your usually prescribed medications unless otherwise directed °3. If you need a refill on your pain medication, please contact your pharmacy.  They will contact our office to request authorization.  Prescriptions will not be filled after 5pm or on week-ends. °4. You should eat very light the first 24 hours after surgery, such as soup, crackers, pudding, etc.  Resume your normal diet the day after surgery. °5. Most patients will experience some swelling and bruising in the breast.  Ice packs and a good support bra will help.  Swelling and bruising can take several days to resolve.  °6. It is common to experience some constipation if taking pain medication after surgery.  Increasing fluid intake and taking a stool softener will usually help or prevent this problem from occurring.  A mild laxative (Milk of Magnesia or Miralax) should be taken according to package directions if there are no bowel movements after 48 hours. °7. Unless discharge instructions indicate otherwise, you may remove your bandages 48 hours after surgery, and you may shower at that time.  You may have steri-strips (small skin tapes) in place directly over the incision.  These strips should be left on the skin for 7-10 days.   Any sutures or staples will be removed at the office during your follow-up visit. °8. ACTIVITIES:  You may resume  regular daily activities (gradually increasing) beginning the next day.  Wearing a good support bra or sports bra (or the breast binder) minimizes pain and swelling.  You may have sexual intercourse when it is comfortable. °a. You may drive when you no longer are taking prescription pain medication, you can comfortably wear a seatbelt, and you can safely maneuver your car and apply brakes. °b. RETURN TO WORK:  __________1 week_______________ °9. You should see your doctor in the office for a follow-up appointment approximately two weeks after your surgery.  Your doctor’s nurse will typically make your follow-up appointment when she calls you with your pathology report.  Expect your pathology report 2-3 business days after your surgery.  You may call to check if you do not hear from us after three days. ° ° °WHEN TO CALL YOUR DOCTOR: °1. Fever over 101.0 °2. Nausea and/or vomiting. °3. Extreme swelling or bruising. °4. Continued bleeding from incision. °5. Increased pain, redness, or drainage from the incision. ° °The clinic staff is available to answer your questions during regular business hours.  Please don’t hesitate to call and ask to speak to one of the nurses for clinical concerns.  If you have a medical emergency, go to the nearest emergency room or call 911.  A surgeon from [facility] is always on call at the hospital. ° °For further questions, please visit [email or web address]  ° °

## 2015-05-31 NOTE — Anesthesia Procedure Notes (Signed)
Procedure Name: LMA Insertion Date/Time: 05/31/2015 2:09 PM Performed by: Enrigue Catena E Pre-anesthesia Checklist: Patient identified, Emergency Drugs available, Suction available, Patient being monitored and Timeout performed Patient Re-evaluated:Patient Re-evaluated prior to inductionOxygen Delivery Method: Circle system utilized Preoxygenation: Pre-oxygenation with 100% oxygen Intubation Type: IV induction Ventilation: Mask ventilation without difficulty LMA: LMA with gastric port inserted Number of attempts: 1 Placement Confirmation: positive ETCO2 and breath sounds checked- equal and bilateral Dental Injury: Teeth and Oropharynx as per pre-operative assessment

## 2015-05-31 NOTE — Interval H&P Note (Signed)
History and Physical Interval Note:  05/31/2015 1:34 PM  Ann Schultz  has presented today for surgery, with the diagnosis of LEFT BREAST MASS  The various methods of treatment have been discussed with the patient and family. After consideration of risks, benefits and other options for treatment, the patient has consented to  Procedure(s): LEFT EXCISIONAL BREAST BIOPSY (Left) as a surgical intervention .  The patient's history has been reviewed, patient examined, no change in status, stable for surgery.  I have reviewed the patient's chart and labs.  Questions were answered to the patient's satisfaction.     [name]

## 2015-05-31 NOTE — Anesthesia Preprocedure Evaluation (Signed)
Anesthesia Evaluation  Patient identified by MRN, date of birth, ID band Patient awake    Reviewed: Allergy & Precautions, NPO status , Patient's Chart, lab work & pertinent test results  Airway Mallampati: II  TM Distance: >3 FB Neck ROM: Full    Dental no notable dental hx.    Pulmonary neg pulmonary ROS,  breath sounds clear to auscultation  Pulmonary exam normal       Cardiovascular negative cardio ROS Normal cardiovascular examRhythm:Regular Rate:Normal     Neuro/Psych negative neurological ROS  negative psych ROS   GI/Hepatic negative GI ROS, Neg liver ROS,   Endo/Other  negative endocrine ROS  Renal/GU negative Renal ROS  negative genitourinary   Musculoskeletal negative musculoskeletal ROS (+)   Abdominal   Peds negative pediatric ROS (+)  Hematology negative hematology ROS (+)   Anesthesia Other Findings   Reproductive/Obstetrics negative OB ROS                             Anesthesia Physical Anesthesia Plan  ASA: II  Anesthesia Plan: General   Post-op Pain Management:    Induction: Intravenous  Airway Management Planned: LMA  Additional Equipment:   Intra-op Plan:   Post-operative Plan: Extubation in OR  Informed Consent: I have reviewed the patients History and Physical, chart, labs and discussed the procedure including the risks, benefits and alternatives for the proposed anesthesia with the patient or authorized representative who has indicated his/her understanding and acceptance.   Dental advisory given  Plan Discussed with: CRNA  Anesthesia Plan Comments:         Anesthesia Quick Evaluation  

## 2015-05-31 NOTE — Transfer of Care (Signed)
Immediate Anesthesia Transfer of Care Note  Patient: Ann Schultz  Procedure(s) Performed: Procedure(s): LEFT EXCISIONAL BREAST BIOPSY (Left)  Patient Location: PACU  Anesthesia Type:General  Level of Consciousness: awake, alert , oriented, patient cooperative and responds to stimulation  Airway & Oxygen Therapy: Patient Spontanous Breathing and Patient connected to face mask oxygen  Post-op Assessment: Report given to RN, Post -op Vital signs reviewed and stable and Patient moving all extremities  Post vital signs: Reviewed and stable  Last Vitals:  Filed Vitals:   05/31/15 1113  BP: 109/70  Pulse: 90  Temp: 36.8 C  Resp: 16    Complications: No apparent anesthesia complications

## 2015-06-01 ENCOUNTER — Encounter (HOSPITAL_COMMUNITY): Payer: Self-pay | Admitting: General Surgery

## 2015-06-04 NOTE — Progress Notes (Signed)
Quick Note:  Please let patient know pathology is benign and is a fibroadenoma as we thought. ______

## 2016-01-15 MED FILL — MEDROXYPROG 150 MG/ML SYR: 150 | 84 days supply | Qty: 1 | Fill #0

## 2016-01-17 DIAGNOSIS — Z6826 Body mass index (BMI) 26.0-26.9, adult: Secondary | ICD-10-CM | POA: Diagnosis not present

## 2016-01-17 DIAGNOSIS — Z01411 Encounter for gynecological examination (general) (routine) with abnormal findings: Secondary | ICD-10-CM | POA: Diagnosis not present

## 2016-01-17 DIAGNOSIS — Z01419 Encounter for gynecological examination (general) (routine) without abnormal findings: Secondary | ICD-10-CM | POA: Diagnosis not present

## 2016-01-17 DIAGNOSIS — Z3009 Encounter for other general counseling and advice on contraception: Secondary | ICD-10-CM | POA: Diagnosis not present

## 2016-01-18 DIAGNOSIS — Z3042 Encounter for surveillance of injectable contraceptive: Secondary | ICD-10-CM | POA: Diagnosis not present

## 2016-04-02 MED FILL — MEDROXYPROG 150 MG/ML SYR: 150 | 84 days supply | Qty: 1 | Fill #1

## 2016-04-03 DIAGNOSIS — Z3042 Encounter for surveillance of injectable contraceptive: Secondary | ICD-10-CM | POA: Diagnosis not present

## 2016-04-08 DIAGNOSIS — N390 Urinary tract infection, site not specified: Secondary | ICD-10-CM | POA: Diagnosis not present

## 2016-06-17 MED FILL — MEDROXYPROG 150 MG/ML SYR: 150 | 84 days supply | Qty: 1 | Fill #2

## 2016-06-19 DIAGNOSIS — Z3042 Encounter for surveillance of injectable contraceptive: Secondary | ICD-10-CM | POA: Diagnosis not present

## 2016-09-03 MED FILL — MEDROXYPROG 150 MG/ML SYR: 150 | 84 days supply | Qty: 1 | Fill #0

## 2016-09-04 DIAGNOSIS — Z3042 Encounter for surveillance of injectable contraceptive: Secondary | ICD-10-CM | POA: Diagnosis not present

## 2016-10-28 ENCOUNTER — Ambulatory Visit (HOSPITAL_COMMUNITY)
Admission: EM | Admit: 2016-10-28 | Discharge: 2016-10-28 | Disposition: A | Discharge status: Home / Self Care | Payer: 59 | Attending: Emergency Medicine | Admitting: Emergency Medicine

## 2016-10-28 ENCOUNTER — Encounter (HOSPITAL_COMMUNITY): Payer: Self-pay | Admitting: *Deleted

## 2016-10-28 DIAGNOSIS — J029 Acute pharyngitis, unspecified: Secondary | ICD-10-CM | POA: Insufficient documentation

## 2016-10-28 DIAGNOSIS — R05 Cough: Secondary | ICD-10-CM | POA: Insufficient documentation

## 2016-10-28 LAB — POCT RAPID STREP A: Streptococcus, Group A Screen (Direct): NEGATIVE

## 2016-10-28 NOTE — ED Provider Notes (Signed)
[name]    CSN: [id number] Arrival date & time: 10/28/16  1124     History   Chief Complaint Chief Complaint  Patient presents with  . Sore Throat  . Cough    HPI Ann Schultz is a 20 y.o. female.   HPI  He is a 20 year old woman here for evaluation of sore throat. She states her symptoms started 2 days ago with diffuse body aches. She quickly developed a sore throat, worse with swallowing. She's been unable to eat food, but is doing well with liquids. She denies any nasal congestion or rhinorrhea. She does report a cough that is productive of yellow phlegm. No shortness of breath or chest pain. No wheezing. No known fevers. She has taken TheraFlu which did improve her body aches.  Past Medical History:  Diagnosis Date  . Allergy   . Breast fibroadenoma    right    Patient Active Problem List   Diagnosis Date Noted  . Breast mass, right, probable fibroadenoma 11/10/2011    Past Surgical History:  Procedure Laterality Date  . CYST EXCISION Right December 2012  . EXCISION OF BREAST BIOPSY Left 05/31/2015   Procedure: LEFT EXCISIONAL BREAST BIOPSY;  Surgeon: Stark Klein, MD;  Location: [facility];  Service: General;  Laterality: Left;    OB History    Gravida Para Term Preterm AB Living   1 1   1   1    SAB TAB Ectopic Multiple Live Births           1       Home Medications    Prior to Admission medications   Not on File    Family History Family History  Problem Relation Age of Onset  . Cancer Maternal Grandmother     breast    Social History Social History  Substance Use Topics  . Smoking status: Never Smoker  . Smokeless tobacco: Never Used  . Alcohol use No     Allergies   Review of patient's allergies indicates no known allergies.   Review of Systems Review of Systems As in history of present illness  Physical Exam Triage Vital Signs ED Triage Vitals [10/28/16 1202]  Enc Vitals Group     BP 115/75     Pulse Rate 89     Resp 16     Temp 98.9 F (37.2 C)     Temp Source Oral     SpO2 100 %     Weight      Height      Head Circumference      Peak Flow      Pain Score 8     Pain Loc      Pain Edu?      Excl. in [place]?    No data found.   Updated Vital Signs BP 115/75 (BP Location: Left Arm)   Pulse 89   Temp 98.9 F (37.2 C) (Oral)   Resp 16   SpO2 100%   Visual Acuity Right Eye Distance:   Left Eye Distance:   Bilateral Distance:    Right Eye Near:   Left Eye Near:    Bilateral Near:     Physical Exam  Constitutional: She is oriented to person, place, and time. She appears well-developed and well-nourished. No distress.  HENT:  Nose: Nose normal.  Mouth/Throat: Oropharyngeal exudate present.  TMs normal bilaterally. Tonsils are swollen and erythematous with exudate present.  Neck: Normal range of motion.  Cardiovascular:  Normal rate, regular rhythm and normal heart sounds.   No murmur heard. Pulmonary/Chest: Effort normal and breath sounds normal. No respiratory distress. She has no wheezes. She has no rales.  Lymphadenopathy:    She has no cervical adenopathy.  Neurological: She is alert and oriented to person, place, and time.  Skin: Skin is warm and dry.     UC Treatments / Results  Labs (all labs ordered are listed, but only abnormal results are displayed) Labs Reviewed  POCT RAPID STREP A    EKG  EKG Interpretation None       Radiology No results found.  Procedures Procedures (including critical care time)  Medications Ordered in UC Medications - No data to display   Initial Impression / Assessment and Plan / UC Course  I have reviewed the triage vital signs and the nursing notes.  Pertinent labs & imaging results that were available during my care of the patient were reviewed by me and considered in my medical decision making (see chart for details).  Clinical Course    Rapid strep is negative. Likely viral etiology. Discussed symptomatic  management and expected time course with the patient. Work note provided. Follow-up as needed.  Final Clinical Impressions(s) / UC Diagnoses   Final diagnoses:  Viral pharyngitis    New Prescriptions Current Discharge Medication List       Melony Overly, MD 10/28/16 1246

## 2016-10-28 NOTE — Discharge Instructions (Signed)
Your strep test is negative. This is likely caused by a virus. Make sure you are getting plenty of rest and drinking plenty of fluids. You can continue the TheraFlu as needed. I recommend Chloraseptic Spray or Cepacol lozenges to help with the sore throat. You should start to feel better in the next 2 days, but it may be another week before you feel back to normal. Follow-up as needed.

## 2016-10-28 NOTE — ED Triage Notes (Signed)
Patient reports sore throat since Sunday with mild cough, reports pain with swallowing and eating.

## 2016-10-30 LAB — CULTURE, GROUP A STREP (THRC)

## 2016-12-02 ENCOUNTER — Ambulatory Visit: Payer: 59 | Admitting: Nurse Practitioner

## 2016-12-04 DIAGNOSIS — Z3042 Encounter for surveillance of injectable contraceptive: Secondary | ICD-10-CM | POA: Diagnosis not present

## 2016-12-04 MED FILL — MEDROXYPROG 150 MG/ML SYR: 150 | 84 days supply | Qty: 1 | Fill #1

## 2016-12-09 DIAGNOSIS — H1089 Other conjunctivitis: Secondary | ICD-10-CM | POA: Diagnosis not present

## 2016-12-30 ENCOUNTER — Ambulatory Visit: Payer: 59 | Admitting: Nurse Practitioner

## 2017-02-17 MED FILL — MEDROXYPROG 150 MG/ML SYR: 150 | 84 days supply | Qty: 1 | Fill #0

## 2017-02-25 DIAGNOSIS — Z3042 Encounter for surveillance of injectable contraceptive: Secondary | ICD-10-CM | POA: Diagnosis not present

## 2017-03-15 ENCOUNTER — Encounter (HOSPITAL_COMMUNITY): Payer: Self-pay

## 2017-03-15 ENCOUNTER — Ambulatory Visit (HOSPITAL_COMMUNITY)
Admission: EM | Admit: 2017-03-15 | Discharge: 2017-03-15 | Disposition: A | Discharge status: Home / Self Care | Payer: 59 | Attending: Radiology | Admitting: Radiology

## 2017-03-15 DIAGNOSIS — N390 Urinary tract infection, site not specified: Secondary | ICD-10-CM

## 2017-03-15 LAB — POCT URINALYSIS DIP (DEVICE)
BILIRUBIN URINE: NEGATIVE
GLUCOSE, UA: NEGATIVE mg/dL
Ketones, ur: NEGATIVE mg/dL
NITRITE: NEGATIVE
Protein, ur: 30 mg/dL — AB
Specific Gravity, Urine: 1.02 (ref 1.005–1.030)
UROBILINOGEN UA: 0.2 mg/dL (ref 0.0–1.0)
pH: 6.5 (ref 5.0–8.0)

## 2017-03-15 MED ORDER — IBUPROFEN 800 MG PO TABS
800.0000 mg | ORAL_TABLET | Freq: Once | ORAL | Status: AC
  Administered 2017-03-15: 800 mg via ORAL

## 2017-03-15 MED ORDER — IBUPROFEN 800 MG PO TABS
ORAL_TABLET | ORAL | Status: AC
  Filled 2017-03-15: qty 1

## 2017-03-15 MED ORDER — SULFAMETHOXAZOLE-TRIMETHOPRIM 800-160 MG PO TABS: 1.0000 | ORAL_TABLET | Freq: Two times a day (BID) | ORAL | 0 refills | Status: AC

## 2017-03-15 NOTE — ED Provider Notes (Signed)
CSN: [id number]     Arrival date & time 03/15/17  1943 History   None    Chief Complaint  Patient presents with  . Urinary Tract Infection   (Consider location/radiation/quality/duration/timing/severity/associated sxs/prior Treatment) 21 y.o. female presents with RLQ X 1 days. Condition is acute in nature. Condition is made better by nothing. Condition is made worse by nothing. Patient denies any relief from 800 ibuprofen  prior to there arrival at this facility. Patient report pressure with urination but no burning, flank pain or fevers       Past Medical History:  Diagnosis Date  . Allergy   . Breast fibroadenoma    right   Past Surgical History:  Procedure Laterality Date  . CYST EXCISION Right December 2012  . EXCISION OF BREAST BIOPSY Left 05/31/2015   Procedure: LEFT EXCISIONAL BREAST BIOPSY;  Surgeon: Stark Klein, MD;  Location: [facility];  Service: General;  Laterality: Left;   Family History  Problem Relation Age of Onset  . Cancer Maternal Grandmother     breast   Social History  Substance Use Topics  . Smoking status: Never Smoker  . Smokeless tobacco: Never Used  . Alcohol use No   OB History    Gravida Para Term Preterm AB Living   1 1   1   1    SAB TAB Ectopic Multiple Live Births           1     Review of Systems  Constitutional: Negative for chills and fever.  HENT: Negative for ear pain and sore throat.   Eyes: Negative for pain and visual disturbance.  Respiratory: Negative for cough and shortness of breath.   Cardiovascular: Negative for chest pain and palpitations.  Gastrointestinal: Negative for abdominal pain and vomiting.  Genitourinary: Negative for dysuria and hematuria.  Musculoskeletal: Negative for arthralgias and back pain.  Skin: Negative for color change and rash.  Neurological: Negative for seizures and syncope.  All other systems reviewed and are negative.   Allergies  Patient has no known allergies.  Home Medications    Prior to Admission medications   Medication Sig Start Date End Date Taking? Authorizing Provider  sulfamethoxazole-trimethoprim (BACTRIM DS,SEPTRA DS) 800-160 MG tablet Take 1 tablet by mouth 2 (two) times daily. 03/15/17 03/22/17  Jacqualine Mau, NP   Meds Ordered and Administered this Visit  Medications - No data to display  BP 118/78 (BP Location: Right Arm)   Pulse 90   Temp 98.2 F (36.8 C) (Oral)   Resp 17   SpO2 100%  No data found.   Physical Exam  Constitutional: She is oriented to person, place, and time. She appears well-developed and well-nourished.  HENT:  Head: Normocephalic and atraumatic.  Eyes: Conjunctivae are normal.  Neck: Normal range of motion.  Cardiovascular: Normal rate and regular rhythm.   Pulmonary/Chest: Effort normal and breath sounds normal.  Abdominal: Soft. There is tenderness ( rlq).  Neurological: She is alert and oriented to person, place, and time.  Psychiatric: She has a normal mood and affect.  Nursing note and vitals reviewed.   Urgent Care Course     Procedures (including critical care time)  Labs Review Labs Reviewed  POCT URINALYSIS DIP (DEVICE) - Abnormal; Notable for the following:       Result Value   Hgb urine dipstick SMALL (*)    Protein, ur 30 (*)    Leukocytes, UA SMALL (*)    All other components within normal limits  Imaging Review No results found.      MDM   1. Lower urinary tract infectious disease       Jacqualine Mau, NP 03/15/17 2112

## 2017-03-15 NOTE — ED Triage Notes (Signed)
Patient presents to [facility] with possible UTI, symptoms include lower back and abdominal pain, pressure while urinating.

## 2017-05-12 MED FILL — MEDROXYPROG 150 MG/ML SYR: 150 | 84 days supply | Qty: 1 | Fill #1

## 2017-05-22 DIAGNOSIS — Z3042 Encounter for surveillance of injectable contraceptive: Secondary | ICD-10-CM | POA: Diagnosis not present

## 2017-08-10 MED FILL — medroxyPROGESTERone ACETATE: 150 | 90 days supply | Qty: 1 | Fill #0

## 2017-08-18 DIAGNOSIS — Z3042 Encounter for surveillance of injectable contraceptive: Secondary | ICD-10-CM | POA: Diagnosis not present

## 2017-08-18 DIAGNOSIS — Z113 Encounter for screening for infections with a predominantly sexual mode of transmission: Secondary | ICD-10-CM | POA: Diagnosis not present

## 2017-08-18 DIAGNOSIS — Z01419 Encounter for gynecological examination (general) (routine) without abnormal findings: Secondary | ICD-10-CM | POA: Diagnosis not present

## 2017-08-18 DIAGNOSIS — Z1329 Encounter for screening for other suspected endocrine disorder: Secondary | ICD-10-CM | POA: Diagnosis not present

## 2017-08-18 DIAGNOSIS — Z6830 Body mass index (BMI) 30.0-30.9, adult: Secondary | ICD-10-CM | POA: Diagnosis not present

## 2017-08-18 DIAGNOSIS — N9089 Other specified noninflammatory disorders of vulva and perineum: Secondary | ICD-10-CM | POA: Diagnosis not present

## 2017-08-19 MED FILL — AZITHROMYCIN 500 MG TABLET: 500 | 1 days supply | Qty: 2 | Fill #0

## 2017-08-27 MED FILL — FLUCONAZOLE 150 MG TABLET: 150 | 1 days supply | Qty: 1 | Fill #0

## 2017-10-20 DIAGNOSIS — N644 Mastodynia: Secondary | ICD-10-CM | POA: Diagnosis not present

## 2017-11-10 DIAGNOSIS — Z3042 Encounter for surveillance of injectable contraceptive: Secondary | ICD-10-CM | POA: Diagnosis not present

## 2017-11-10 MED FILL — medroxyPROGESTERone ACETATE: 150 | 84 days supply | Qty: 1 | Fill #0

## 2018-01-13 ENCOUNTER — Other Ambulatory Visit: Payer: Self-pay

## 2018-01-13 ENCOUNTER — Emergency Department (HOSPITAL_BASED_OUTPATIENT_CLINIC_OR_DEPARTMENT_OTHER)
Admission: EM | Admit: 2018-01-13 | Discharge: 2018-01-13 | Disposition: A | Discharge status: Home / Self Care | Payer: 59 | Source: Home / Self Care | Attending: Emergency Medicine | Admitting: Emergency Medicine

## 2018-01-13 ENCOUNTER — Encounter (HOSPITAL_BASED_OUTPATIENT_CLINIC_OR_DEPARTMENT_OTHER): Payer: Self-pay

## 2018-01-13 ENCOUNTER — Emergency Department (HOSPITAL_COMMUNITY): Payer: 59

## 2018-01-13 ENCOUNTER — Encounter (HOSPITAL_COMMUNITY): Payer: Self-pay | Admitting: Emergency Medicine

## 2018-01-13 ENCOUNTER — Emergency Department (HOSPITAL_COMMUNITY)
Admission: EM | Admit: 2018-01-13 | Discharge: 2018-01-13 | Disposition: A | Discharge status: Home / Self Care | Payer: 59 | Attending: Emergency Medicine | Admitting: Emergency Medicine

## 2018-01-13 DIAGNOSIS — R079 Chest pain, unspecified: Secondary | ICD-10-CM | POA: Diagnosis not present

## 2018-01-13 DIAGNOSIS — R0789 Other chest pain: Secondary | ICD-10-CM | POA: Diagnosis not present

## 2018-01-13 DIAGNOSIS — Z5321 Procedure and treatment not carried out due to patient leaving prior to being seen by health care provider: Secondary | ICD-10-CM | POA: Insufficient documentation

## 2018-01-13 DIAGNOSIS — R1013 Epigastric pain: Secondary | ICD-10-CM

## 2018-01-13 LAB — CBC WITH DIFFERENTIAL/PLATELET
Basophils Absolute: 0.1 10*3/uL (ref 0.0–0.1)
Basophils Relative: 1 %
Eosinophils Absolute: 0.1 10*3/uL (ref 0.0–0.7)
Eosinophils Relative: 1 %
HEMATOCRIT: 41.9 % (ref 36.0–46.0)
Hemoglobin: 14.9 g/dL (ref 12.0–15.0)
LYMPHS PCT: 28 %
Lymphs Abs: 2.5 10*3/uL (ref 0.7–4.0)
MCH: 30.4 pg (ref 26.0–34.0)
MCHC: 35.6 g/dL (ref 30.0–36.0)
MCV: 85.5 fL (ref 78.0–100.0)
MONO ABS: 0.5 10*3/uL (ref 0.1–1.0)
MONOS PCT: 5 %
NEUTROS ABS: 5.8 10*3/uL (ref 1.7–7.7)
Neutrophils Relative %: 65 %
Platelets: 264 10*3/uL (ref 150–400)
RBC: 4.9 MIL/uL (ref 3.87–5.11)
RDW: 13.1 % (ref 11.5–15.5)
WBC: 8.8 10*3/uL (ref 4.0–10.5)

## 2018-01-13 LAB — I-STAT TROPONIN, ED: Troponin i, poc: 0 ng/mL (ref 0.00–0.08)

## 2018-01-13 LAB — BASIC METABOLIC PANEL
Anion gap: 11 (ref 5–15)
BUN: 8 mg/dL (ref 6–20)
CALCIUM: 9.4 mg/dL (ref 8.9–10.3)
CO2: 20 mmol/L — AB (ref 22–32)
CREATININE: 0.75 mg/dL (ref 0.44–1.00)
Chloride: 108 mmol/L (ref 101–111)
GFR calc Af Amer: >60 mL/min (ref >60)
GFR calc non Af Amer: >60 mL/min (ref >60)
GLUCOSE: 104 mg/dL — AB (ref 65–99)
Potassium: 3.8 mmol/L (ref 3.5–5.1)
Sodium: 139 mmol/L (ref 135–145)

## 2018-01-13 MED ORDER — GI COCKTAIL ~~LOC~~
30.0000 mL | Freq: Once | ORAL | Status: AC
  Administered 2018-01-13: 30 mL via ORAL
  Filled 2018-01-13: qty 30

## 2018-01-13 MED ORDER — PANTOPRAZOLE SODIUM 20 MG PO TBEC: 20.0000 mg | DELAYED_RELEASE_TABLET | Freq: Two times a day (BID) | ORAL | 0 refills | Status: DC

## 2018-01-13 NOTE — ED Notes (Signed)
NAD at this time. Pt is stable and going home.  

## 2018-01-13 NOTE — ED Triage Notes (Signed)
Cp  X 2 months pain would come and go  Every 3 days  Last for a couple of days , has only taking motrin, and she had a h/a today  Has not eaten today no n/v/sob

## 2018-01-13 NOTE — Discharge Instructions (Signed)
It was my pleasure taking care of you today!   You were seen in the Emergency Department today for chest pain.  As we have discussed, today?s blood work and imaging are normal, but you still should follow up with your doctor. Please call your primary care physician to schedule a follow up appointment to discuss your ER visit today. Take protonix twice daily.   Return to the Emergency Department if you experience any further chest pain/pressure/tightness, difficulty breathing, vomiting or other symptoms that concern you.

## 2018-01-13 NOTE — ED Provider Notes (Signed)
[facility] EMERGENCY DEPARTMENT Provider Note   CSN: [id number] Arrival date & time: 01/13/18  1625     History   Chief Complaint Chief Complaint  Patient presents with  . Heartburn    HPI Ann Schultz is a 22 y.o. female.  The history is provided by the patient and medical records. No language interpreter was used.  Heartburn  Associated symptoms include chest pain.   Ann Schultz is a 22 y.o. female  with no pertinent PMH who presents to the Emergency Department complaining of central chest pain x 2 months. Does not occur daily and is not related to exertion. Typically occurs about 3x per week. Has not noticed any aggravating factors or correlation with eating. Has tried ibuprofen which relieves pain. No shortness of breath, abdominal pain, cough, congestion, fever, chills, nausea, vomiting, diarrhea or constipation. No long travel, recent surgeries / immobilizations. No hx of DVT / PE. Not on OCP's. Seen at [facility] ER earlier and labs, ekg, cxr obtained, however was still in waiting room when they decided to leave due to long wait. She was given GI cocktail and symptoms now resolved.   Past Medical History:  Diagnosis Date  . Allergy   . Breast fibroadenoma    right    Patient Active Problem List   Diagnosis Date Noted  . Breast mass, right, probable fibroadenoma 11/10/2011    Past Surgical History:  Procedure Laterality Date  . CYST EXCISION Right December 2012  . EXCISION OF BREAST BIOPSY Left 05/31/2015   Procedure: LEFT EXCISIONAL BREAST BIOPSY;  Surgeon: Stark Klein, MD;  Location: [facility];  Service: General;  Laterality: Left;    OB History    Gravida Para Term Preterm AB Living   1 1   1   1    SAB TAB Ectopic Multiple Live Births           1       Home Medications    Prior to Admission medications   Medication Sig Start Date End Date Taking? Authorizing Provider  pantoprazole (PROTONIX) 20 MG tablet Take 1 tablet (20 mg total) by  mouth 2 (two) times daily. 01/13/18   Ward, Ozella Almond, PA-C    Family History Family History  Problem Relation Age of Onset  . Cancer Maternal Grandmother        breast    Social History Social History   Tobacco Use  . Smoking status: Never Smoker  . Smokeless tobacco: Never Used  Substance Use Topics  . Alcohol use: No  . Drug use: No     Allergies   Patient has no known allergies.   Review of Systems Review of Systems  Cardiovascular: Positive for chest pain. Negative for palpitations and leg swelling.  Gastrointestinal: Positive for heartburn.  All other systems reviewed and are negative.    Physical Exam Updated Vital Signs BP 116/71 (BP Location: Right Arm)   Pulse 61   Temp 98.2 F (36.8 C) (Oral)   Resp 16   Ht 5\' 4"  (1.626 m)   Wt 81.6 kg (180 lb)   SpO2 100%   BMI 30.90 kg/m   Physical Exam  Constitutional: She is oriented to person, place, and time. She appears well-developed and well-nourished. No distress.  HENT:  Head: Normocephalic and atraumatic.  Cardiovascular: Normal rate, regular rhythm and normal heart sounds.  No murmur heard. Pulmonary/Chest: Effort normal and breath sounds normal. No respiratory distress. She exhibits tenderness.  Abdominal: Soft.  Bowel sounds are normal. She exhibits no distension. There is tenderness.  Epigastric tenderness without rebound or guarding. Negative Murphy's.   Musculoskeletal: She exhibits no edema.  Neurological: She is alert and oriented to person, place, and time.  Skin: Skin is warm and dry.  Nursing note and vitals reviewed.    ED Treatments / Results  Labs (all labs ordered are listed, but only abnormal results are displayed) Labs Reviewed - No data to display  EKG  EKG Interpretation None       Radiology Dg Chest 2 View  Result Date: 01/13/2018 CLINICAL DATA:  Intermittent dull chest pain for the past 2 months with increased severity today. Nonsmoker. EXAM: CHEST  2 VIEW  COMPARISON:  None in PACs FINDINGS: The lungs are well-expanded. There is no focal infiltrate. There is no pleural effusion. The retrosternal soft tissues are normal. The heart and pulmonary vascularity are normal. The bony thorax exhibits no acute abnormality. IMPRESSION: There is no active cardiopulmonary disease. Electronically Signed   By: David  Martinique M.D.   On: 01/13/2018 14:37    Procedures Procedures (including critical care time)  Medications Ordered in ED Medications - No data to display   Initial Impression / Assessment and Plan / ED Course  I have reviewed the triage vital signs and the nursing notes.  Pertinent labs & imaging results that were available during my care of the patient were reviewed by me and considered in my medical decision making (see chart for details).    Ann Schultz is a 22 y.o. female who presents to ED for chest pain x 2 months. On exam, patient afebrile, hemodynamically stable with tenderness to palpation of both chest and epigastrium.   Labs, CXR and EKG obtained earlier today and Elvina Sidle and were reviewed. Blood work reassuring including negative troponin. CXR with no acute abnormalities.  EKG NSR.  Patient's symptoms unlikely to be of cardiac etiology. Likely gi. Will start on protonix and have patient follow up with PCP. Patient has been advised to return to the ED if development of any exertional chest pain, trouble breathing, new/worsening symptoms or for any additional concerns. Evaluation does not show pathology that would require ongoing emergent intervention or inpatient treatment. Patient understands return precautions and follow up plan. All questions answered.  Final Clinical Impressions(s) / ED Diagnoses   Final diagnoses:  Atypical chest pain  Epigastric pain    ED Discharge Orders        Ordered    pantoprazole (PROTONIX) 20 MG tablet  2 times daily     01/13/18 1815       Ward, Ozella Almond, PA-C 01/13/18 1851      Julianne Rice, MD 01/15/18 [phone]

## 2018-01-13 NOTE — ED Provider Notes (Signed)
Patient placed in Quick Look pathway, seen and evaluated   Chief Complaint: chest pain  HPI:   c/p intermittently x 2 month, pain 5/10, burning lasting several hrs.    ROS: no fever, no nausea or vomiting   Physical Exam:   Gen: No distress  Neuro: Awake and Alert  Skin: Warm    Focused Exam: Head is normocephalic and atraumatic.  Neck is supple. Lungs are clear without rales, wheezes, rhonchi. Heart has regular rate and rhythm without murmur. Abdomen is soft, nontender tender. Back is nontender. Extremities have no cyanosis or edema, full range of motion is present. Skin is warm and moist without rash. Neurologic: Mental status is normal. Psychiatric: No abnormalities of mood or affect.    Initiation of care has begun. The patient has been counseled on the process, plan, and necessity for staying for the completion/evaluation, and the remainder of the medical screening examination    Domenic Moras, Hershal Coria 01/13/18 1338    Lacretia Leigh, MD 01/14/18 [phone]

## 2018-01-13 NOTE — ED Triage Notes (Signed)
Pt c/o midline chest "burning" off and on for two months, today it was accompanied by a headache, was seen at [facility] had EKG and lab work as well as a GI cocktail and her symptoms have resolved

## 2018-01-25 MED FILL — medroxyPROGESTERone ACETATE: 150 | 84 days supply | Qty: 1 | Fill #1

## 2018-04-26 MED FILL — medroxyPROGESTERone ACETATE: 150 | 90 days supply | Qty: 1 | Fill #2

## 2018-06-02 ENCOUNTER — Other Ambulatory Visit: Payer: Self-pay | Admitting: Obstetrics and Gynecology

## 2018-06-02 DIAGNOSIS — N644 Mastodynia: Secondary | ICD-10-CM

## 2018-06-14 ENCOUNTER — Other Ambulatory Visit: Payer: 59

## 2018-06-25 ENCOUNTER — Inpatient Hospital Stay
Admission: RE | Admit: 2018-06-25 | Discharge: 2018-06-25 | Disposition: A | Discharge status: Home / Self Care | Payer: BC Managed Care – PPO | Source: Ambulatory Visit | Attending: Obstetrics and Gynecology | Admitting: Obstetrics and Gynecology

## 2018-07-14 MED FILL — medroxyPROGESTERone ACETATE: 150 | 90 days supply | Qty: 1 | Fill #3

## 2018-10-12 MED FILL — medroxyPROGESTERone ACETATE: 150 | 90 days supply | Qty: 1 | Fill #0

## 2019-01-10 MED FILL — AZITHROMYCIN 500 MG TABLET: 500 | 1 days supply | Qty: 2 | Fill #0

## 2019-01-10 MED FILL — medroxyPROGESTERone ACETATE: 150 | 90 days supply | Qty: 1 | Fill #0

## 2019-01-24 MED FILL — AZITHROMYCIN 500 MG TABLET: 500 | 1 days supply | Qty: 2 | Fill #0

## 2019-03-30 MED FILL — NORETHIN-ESTRAD-FERR 1-0.02: 1-20 | 28 days supply | Qty: 28 | Fill #0

## 2019-04-21 MED FILL — LARIN FE 1.5-30 TABLET: 1.5-30 | 28 days supply | Qty: 28 | Fill #0

## 2019-06-28 ENCOUNTER — Other Ambulatory Visit: Payer: Self-pay | Admitting: *Deleted

## 2019-06-28 DIAGNOSIS — Z20822 Contact with and (suspected) exposure to covid-19: Secondary | ICD-10-CM

## 2019-07-05 LAB — SPECIMEN STATUS REPORT

## 2019-07-05 LAB — NOVEL CORONAVIRUS, NAA: SARS-CoV-2, NAA: NOT DETECTED

## 2019-07-14 ENCOUNTER — Telehealth: Payer: Self-pay | Admitting: Hematology

## 2019-07-14 NOTE — Telephone Encounter (Signed)
Pt is calling and she is aware covid 19 test is negative

## 2020-05-22 ENCOUNTER — Ambulatory Visit (INDEPENDENT_AMBULATORY_CARE_PROVIDER_SITE_OTHER): Payer: BC Managed Care – PPO

## 2020-05-22 ENCOUNTER — Other Ambulatory Visit: Payer: Self-pay

## 2020-05-22 ENCOUNTER — Encounter (HOSPITAL_COMMUNITY): Payer: Self-pay | Admitting: Orthopedic Surgery

## 2020-05-22 ENCOUNTER — Ambulatory Visit (HOSPITAL_COMMUNITY)
Admission: EM | Admit: 2020-05-22 | Discharge: 2020-05-22 | Disposition: A | Discharge status: Home / Self Care | Payer: BC Managed Care – PPO | Attending: Physician Assistant | Admitting: Physician Assistant

## 2020-05-22 DIAGNOSIS — M79671 Pain in right foot: Secondary | ICD-10-CM

## 2020-05-22 MED ORDER — IBUPROFEN 800 MG PO TABS: 800.0000 mg | ORAL_TABLET | Freq: Three times a day (TID) | ORAL | 0 refills | Status: DC

## 2020-05-22 NOTE — Discharge Instructions (Signed)
Take the ibuprofen every 8 hours Use the boot for comfort Schedule follow-up with your primary care to evaluate your foot pain and improvements.

## 2020-05-22 NOTE — ED Provider Notes (Signed)
[name]    CSN: [id number] Arrival date & time: 05/22/20  1221      History   Chief Complaint Chief Complaint  Patient presents with  . Foot Pain    HPI Ann Schultz is a 24 y.o. female.   Patient reports for evaluation of right foot pain.  She reports 3 days ago she stepped on a vehicle and felt a sharp pain in her right foot.  It has been painful since that time with walking.  She reports pain is in the outside of her foot and the top.  Hurts to touch.  She has tried Tylenol and ice without much relief.  Patient reports her mother gave her a cream for this and also did not help.  She reports significant pain with walking has been having to" drag the foot" due to pain.  Denies previous injury.  She is unsure whether she stepped wrong or rolled the ankle or anything like that.  Denies numbness/tingling.     Past Medical History:  Diagnosis Date  . Allergy   . Breast fibroadenoma    right    Patient Active Problem List   Diagnosis Date Noted  . Breast mass, right, probable fibroadenoma 11/10/2011    Past Surgical History:  Procedure Laterality Date  . CYST EXCISION Right December 2012  . EXCISION OF BREAST BIOPSY Left 05/31/2015   Procedure: LEFT EXCISIONAL BREAST BIOPSY;  Surgeon: Stark Klein, MD;  Location: [facility];  Service: General;  Laterality: Left;    OB History    Gravida  1   Para  1   Term      Preterm  1   AB      Living  1     SAB      TAB      Ectopic      Multiple      Live Births  1            Home Medications    Prior to Admission medications   Medication Sig Start Date End Date Taking? Authorizing Provider  ibuprofen (ADVIL) 800 MG tablet Take 1 tablet (800 mg total) by mouth 3 (three) times daily. 05/22/20   Kiernan Farkas, Marguerita Beards, PA-C  pantoprazole (PROTONIX) 20 MG tablet Take 1 tablet (20 mg total) by mouth 2 (two) times daily. 01/13/18   Ward, Ozella Almond, PA-C    Family History Family History  Problem  Relation Age of Onset  . Cancer Maternal Grandmother        breast    Social History Social History   Tobacco Use  . Smoking status: Never Smoker  . Smokeless tobacco: Never Used  Substance Use Topics  . Alcohol use: No  . Drug use: No     Allergies   Patient has no known allergies.   Review of Systems Review of Systems   Physical Exam Triage Vital Signs ED Triage Vitals  Enc Vitals Group     BP 05/22/20 1253 121/79     Pulse Rate 05/22/20 1253 82     Resp 05/22/20 1253 16     Temp 05/22/20 1253 98.2 F (36.8 C)     Temp Source 05/22/20 1253 Oral     SpO2 05/22/20 1253 100 %     Weight --      Height --      Head Circumference --      Peak Flow --      Pain Score 05/22/20  1259 6     Pain Loc --      Pain Edu? --      Excl. in [place]? --    No data found.  Updated Vital Signs BP 121/79 (BP Location: Left Arm)   Pulse 82   Temp 98.2 F (36.8 C) (Oral)   Resp 16   LMP 05/15/2020 Comment: no birthcontrol  SpO2 100%   Visual Acuity Right Eye Distance:   Left Eye Distance:   Bilateral Distance:    Right Eye Near:   Left Eye Near:    Bilateral Near:     Physical Exam Vitals and nursing note reviewed.  Constitutional:      General: She is not in acute distress.    Appearance: She is well-developed.  HENT:     Head: Normocephalic and atraumatic.  Eyes:     Conjunctiva/sclera: Conjunctivae normal.  Cardiovascular:     Rate and Rhythm: Normal rate and regular rhythm.     Heart sounds: No murmur.  Pulmonary:     Effort: Pulmonary effort is normal. No respiratory distress.     Breath sounds: Normal breath sounds.  Abdominal:     Palpations: Abdomen is soft.     Tenderness: There is no abdominal tenderness.  Musculoskeletal:     Cervical back: Neck supple.     Comments: Right foot with mild swelling over the base of the fifth metatarsal and an area of distribution of the ATFL ligament.  There is tenderness palpation of the base of the fifth and ATFL  distribution.  Pain elicited with tilt.  No pain with palpation of the midfoot over the base of the fifth.  Patient has full range of motion of the foot with good strength.  Patient with significant pain with ambulation.  Pulses 2+.  Cap refill less than 2 seconds.  Skin:    General: Skin is warm and dry.  Neurological:     Mental Status: She is alert.      UC Treatments / Results  Labs (all labs ordered are listed, but only abnormal results are displayed) Labs Reviewed - No data to display  EKG   Radiology DG Foot Complete Right  Result Date: 05/22/2020 CLINICAL DATA:  Pain after stepping out of vehicle over base of fifth metatarsal EXAM: RIGHT FOOT COMPLETE - 3+ VIEW COMPARISON:  None. FINDINGS: There is no evidence of fracture or dislocation. There is no evidence of arthropathy or other focal bone abnormality. Soft tissues are unremarkable. IMPRESSION: Negative. Electronically Signed   By: Macy Mis M.D.   On: 05/22/2020 13:54    Procedures Procedures (including critical care time)  Medications Ordered in UC Medications - No data to display  Initial Impression / Assessment and Plan / UC Course  I have reviewed the triage vital signs and the nursing notes.  Pertinent labs & imaging results that were available during my care of the patient were reviewed by me and considered in my medical decision making (see chart for details).     #Right foot pain Patient 24 year old with acute right foot pain.  X-ray was negative.  Likely she rolled the ankle without not knowing this is a mild ligamentous injury.  Given location of pain we will put her in a postop boot to help offset location of pain.  Ibuprofen for pain and inflammation.  To follow-up with primary care for evaluation of improvement over the next 1 to 2 weeks.  Patient verbalized understanding of the plan. Final Clinical  Impressions(s) / UC Diagnoses   Final diagnoses:  Foot pain, right     Discharge Instructions      Take the ibuprofen every 8 hours Use the boot for comfort Schedule follow-up with your primary care to evaluate your foot pain and improvements.    ED Prescriptions    Medication Sig Dispense Auth. Provider   ibuprofen (ADVIL) 800 MG tablet Take 1 tablet (800 mg total) by mouth 3 (three) times daily. 21 tablet Daiki Dicostanzo, Marguerita Beards, PA-C     PDMP not reviewed this encounter.   Purnell Shoemaker, PA-C 05/22/20 2057

## 2020-05-22 NOTE — ED Triage Notes (Signed)
Pt reports right foot pain since Saturday. No injury or trauma. Ice and Tylenol with no relief.   Right foot painful to touch.

## 2020-11-01 LAB — OB RESULTS CONSOLE HEPATITIS B SURFACE ANTIGEN: Hepatitis B Surface Ag: NEGATIVE

## 2020-11-01 LAB — OB RESULTS CONSOLE RUBELLA ANTIBODY, IGM: Rubella: IMMUNE

## 2020-11-01 LAB — OB RESULTS CONSOLE HIV ANTIBODY (ROUTINE TESTING): HIV: NONREACTIVE

## 2020-11-12 ENCOUNTER — Other Ambulatory Visit: Payer: Self-pay

## 2020-11-12 ENCOUNTER — Inpatient Hospital Stay (HOSPITAL_COMMUNITY)
Admission: AD | Admit: 2020-11-12 | Discharge: 2020-11-12 | Disposition: A | Discharge status: Home / Self Care | Payer: BC Managed Care – PPO | Attending: Obstetrics and Gynecology | Admitting: Obstetrics and Gynecology

## 2020-11-12 ENCOUNTER — Encounter (HOSPITAL_COMMUNITY): Payer: Self-pay | Admitting: Obstetrics and Gynecology

## 2020-11-12 ENCOUNTER — Inpatient Hospital Stay (HOSPITAL_COMMUNITY): Payer: BC Managed Care – PPO

## 2020-11-12 DIAGNOSIS — O209 Hemorrhage in early pregnancy, unspecified: Secondary | ICD-10-CM | POA: Diagnosis present

## 2020-11-12 DIAGNOSIS — Z3A09 9 weeks gestation of pregnancy: Secondary | ICD-10-CM | POA: Insufficient documentation

## 2020-11-12 DIAGNOSIS — Z6790 Unspecified blood type, Rh positive: Secondary | ICD-10-CM | POA: Diagnosis not present

## 2020-11-12 DIAGNOSIS — O4690 Antepartum hemorrhage, unspecified, unspecified trimester: Secondary | ICD-10-CM

## 2020-11-12 DIAGNOSIS — O4691 Antepartum hemorrhage, unspecified, first trimester: Secondary | ICD-10-CM

## 2020-11-12 LAB — URINALYSIS, ROUTINE W REFLEX MICROSCOPIC
Bilirubin Urine: NEGATIVE
Glucose, UA: NEGATIVE mg/dL
Ketones, ur: NEGATIVE mg/dL
Nitrite: NEGATIVE
Protein, ur: NEGATIVE mg/dL
Specific Gravity, Urine: 1.004 — ABNORMAL LOW (ref 1.005–1.030)
pH: 6 (ref 5.0–8.0)

## 2020-11-12 LAB — CBC
HCT: 38 % (ref 36.0–46.0)
Hemoglobin: 13.5 g/dL (ref 12.0–15.0)
MCH: 29.7 pg (ref 26.0–34.0)
MCHC: 35.5 g/dL (ref 30.0–36.0)
MCV: 83.5 fL (ref 80.0–100.0)
Platelets: 244 10*3/uL (ref 150–400)
RBC: 4.55 MIL/uL (ref 3.87–5.11)
RDW: 13.2 % (ref 11.5–15.5)
WBC: 13.2 10*3/uL — ABNORMAL HIGH (ref 4.0–10.5)
nRBC: 0 % (ref 0.0–0.2)

## 2020-11-12 LAB — WET PREP, GENITAL
Sperm: NONE SEEN
Trich, Wet Prep: NONE SEEN
Yeast Wet Prep HPF POC: NONE SEEN

## 2020-11-12 LAB — HCG, QUANTITATIVE, PREGNANCY: hCG, Beta Chain, Quant, S: 191778 m[IU]/mL — ABNORMAL HIGH (ref <5)

## 2020-11-12 LAB — POCT PREGNANCY, URINE: Preg Test, Ur: POSITIVE — AB

## 2020-11-12 NOTE — MAU Provider Note (Signed)
History     CSN: [id number]  Arrival date and time: 11/12/20 9509   First Provider Initiated Contact with Patient 11/12/20 2125      Chief Complaint  Patient presents with  . Abdominal Pain  . Vaginal Bleeding   24 y.o. [id number] @[redacted]w[redacted]d  by sure LMP presenting with spotting. Reports seeing dark red blood in her underwear this evening. No bleeding since. No recent IC. Endorses LBP when she was on the way here. Since she arrived is now feeling low abdominal cramping.   OB History    Gravida  2   Para  1   Term      Preterm  1   AB      Living  1     SAB      TAB      Ectopic      Multiple      Live Births  1           Past Medical History:  Diagnosis Date  . Allergy   . Breast fibroadenoma    right    Past Surgical History:  Procedure Laterality Date  . CYST EXCISION Right December 2012  . EXCISION OF BREAST BIOPSY Left 05/31/2015   Procedure: LEFT EXCISIONAL BREAST BIOPSY;  Surgeon: Stark Klein, MD;  Location: [facility];  Service: General;  Laterality: Left;    Family History  Problem Relation Age of Onset  . Cancer Maternal Grandmother        breast    Social History   Tobacco Use  . Smoking status: Never Smoker  . Smokeless tobacco: Never Used  Substance Use Topics  . Alcohol use: No  . Drug use: No    Allergies: No Known Allergies  Medications Prior to Admission  Medication Sig Dispense Refill Last Dose  . Prenatal Vit-Fe Fumarate-FA (PRENATAL MULTIVITAMIN) TABS tablet Take 1 tablet by mouth daily at 12 noon.   11/12/2020 at Unknown time  . ibuprofen (ADVIL) 800 MG tablet Take 1 tablet (800 mg total) by mouth 3 (three) times daily. 21 tablet 0  at not taking  . pantoprazole (PROTONIX) 20 MG tablet Take 1 tablet (20 mg total) by mouth 2 (two) times daily. 30 tablet 0  at not taking    Review of Systems  Gastrointestinal: Positive for abdominal pain.  Genitourinary: Positive for vaginal bleeding.  Musculoskeletal: Positive for back  pain.   Physical Exam   Blood pressure 136/72, pulse 89, temperature 98 F (36.7 C), resp. rate 18, height 5\' 6"  (1.676 m), weight 95.9 kg, last menstrual period 09/06/2020, SpO2 100 %.  Physical Exam Vitals and nursing note reviewed. Exam conducted with a chaperone present.  Constitutional:      General: She is not in acute distress.    Appearance: Normal appearance.  HENT:     Head: Normocephalic and atraumatic.  Cardiovascular:     Rate and Rhythm: Normal rate.  Pulmonary:     Effort: Pulmonary effort is normal. No respiratory distress.  Abdominal:     General: There is no distension.     Palpations: There is no mass.     Tenderness: There is no abdominal tenderness. There is no guarding or rebound.  Genitourinary:    Comments: External: no lesions or erythema Vagina: rugated, pink, moist, scant pink discharge Uterus: + enlarged, anteverted, non tender, no CMT Adnexae: no masses, no tenderness left, no tenderness right Cervix closed/long  Musculoskeletal:        General: Normal range  of motion.     Cervical back: Normal range of motion.  Skin:    General: Skin is warm and dry.  Neurological:     General: No focal deficit present.     Mental Status: She is alert and oriented to person, place, and time.  Psychiatric:        Mood and Affect: Mood normal.        Behavior: Behavior normal.    Results for orders placed or performed during the hospital encounter of 11/12/20 (from the past 24 hour(s))  Urinalysis, Routine w reflex microscopic Urine, Clean Catch     Status: Abnormal   Collection Time: 11/12/20  8:07 PM  Result Value Ref Range   Color, Urine YELLOW YELLOW   APPearance HAZY (A) CLEAR   Specific Gravity, Urine 1.004 (L) 1.005 - 1.030   pH 6.0 5.0 - 8.0   Glucose, UA NEGATIVE NEGATIVE mg/dL   Hgb urine dipstick MODERATE (A) NEGATIVE   Bilirubin Urine NEGATIVE NEGATIVE   Ketones, ur NEGATIVE NEGATIVE mg/dL   Protein, ur NEGATIVE NEGATIVE mg/dL   Nitrite  NEGATIVE NEGATIVE   Leukocytes,Ua SMALL (A) NEGATIVE   RBC / HPF 0-5 0 - 5 RBC/hpf   WBC, UA 0-5 0 - 5 WBC/hpf   Bacteria, UA RARE (A) NONE SEEN   Squamous Epithelial / LPF 11-20 0 - 5   Mucus PRESENT   Pregnancy, urine POC     Status: Abnormal   Collection Time: 11/12/20  8:33 PM  Result Value Ref Range   Preg Test, Ur POSITIVE (A) NEGATIVE  Wet prep, genital     Status: Abnormal   Collection Time: 11/12/20  9:37 PM   Specimen: PATH Cytology Cervicovaginal Ancillary Only  Result Value Ref Range   Yeast Wet Prep HPF POC NONE SEEN NONE SEEN   Trich, Wet Prep NONE SEEN NONE SEEN   Clue Cells Wet Prep HPF POC PRESENT (A) NONE SEEN   WBC, Wet Prep HPF POC MANY (A) NONE SEEN   Sperm NONE SEEN   CBC     Status: Abnormal   Collection Time: 11/12/20 10:04 PM  Result Value Ref Range   WBC 13.2 (H) 4.0 - 10.5 K/uL   RBC 4.55 3.87 - 5.11 MIL/uL   Hemoglobin 13.5 12.0 - 15.0 g/dL   HCT 38.0 36 - 46 %   MCV 83.5 80.0 - 100.0 fL   MCH 29.7 26.0 - 34.0 pg   MCHC 35.5 30.0 - 36.0 g/dL   RDW 13.2 11.5 - 15.5 %   Platelets 244 150 - 400 K/uL   nRBC 0.0 0.0 - 0.2 %  hCG, quantitative, pregnancy     Status: Abnormal   Collection Time: 11/12/20 10:04 PM  Result Value Ref Range   hCG, Beta Chain, Quant, S 191,778 (H) <5 mIU/mL   US OB Comp Less 14 Wks  Result Date: 11/12/2020 CLINICAL DATA:  Vaginal bleeding, quantitative beta hCG pending EXAM: OBSTETRIC <14 WK ULTRASOUND TECHNIQUE: Transabdominal ultrasound was performed for evaluation of the gestation as well as the maternal uterus and adnexal regions. COMPARISON:  Obstetrical ultrasound from prior gestation 08/03/2012 FINDINGS: Intrauterine gestational sac: Single Yolk sac:  Visualized. Embryo:  Visualized. Cardiac Activity: Visualized. Heart Rate: 162 bpm CRL:   25.1 mm   9 w 1 d                  [place] EDC: 06/16/2021 Subchorionic hemorrhage:  None visualized. Maternal uterus/adnexae: Maternal uterus is unremarkable. Ovaries  are unremarkable. No  free fluid. IMPRESSION: Single intrauterine gestation at 9 weeks, 1 days by crown-rump length sonographic estimation. No acute sonographic complication is evident. Electronically Signed   By: Lovena Le M.D.   On: 11/12/2020 22:38   MAU Course  Procedures  MDM Labs and [place] ordered and reviewed. Viable IUP on [place], no signs of [place]. Discussed findings with pt and SAB precautions. LBP likely MSK. Stable for discharge home.  Assessment and Plan   1. [redacted] weeks gestation of pregnancy   2. Vaginal bleeding in pregnancy   3. Blood type, Rh positive    Discharge home Follow up at [facility] as scheduled this week Pelvic rest SAB precautions  Allergies as of 11/12/2020   No Known Allergies     Medication List    STOP taking these medications   ibuprofen 800 MG tablet Commonly known as: ADVIL     TAKE these medications   pantoprazole 20 MG tablet Commonly known as: PROTONIX Take 1 tablet (20 mg total) by mouth 2 (two) times daily.   prenatal multivitamin Tabs tablet Take 1 tablet by mouth daily at 12 noon.      Julianne Handler, CNM 11/12/2020, 11:23 PM

## 2020-11-12 NOTE — MAU Note (Addendum)
PT SAYS WENT TO B-ROOM AT 615PM- FELT SOMETHING COME OUT OF VAG- SAW 2 SPOTS BLOOD- WHEN WIPED - PINK. CALLED DR- TOLD TO COME IN IF HEAVY.    THEN BACK HURT.    BACK  HURTS SAME.   CRAMPS IN ABD . PAD ON- IN TRIAGE SMEARS. Marland Kitchen LAST SEX- SEPT.   IN OFFICE - LABS, U/S-

## 2020-11-12 NOTE — Discharge Instructions (Signed)

## 2020-11-13 LAB — GC/CHLAMYDIA PROBE AMP (~~LOC~~) NOT AT ARMC
Chlamydia: NEGATIVE
Comment: NEGATIVE
Comment: NORMAL
Neisseria Gonorrhea: NEGATIVE

## 2020-11-14 ENCOUNTER — Other Ambulatory Visit: Payer: Self-pay | Admitting: Obstetrics and Gynecology

## 2020-11-14 DIAGNOSIS — E049 Nontoxic goiter, unspecified: Secondary | ICD-10-CM

## 2020-11-20 ENCOUNTER — Encounter (HOSPITAL_COMMUNITY): Payer: Self-pay

## 2020-11-20 ENCOUNTER — Ambulatory Visit (HOSPITAL_COMMUNITY): Admission: RE | Admit: 2020-11-20 | Payer: BC Managed Care – PPO | Source: Ambulatory Visit

## 2020-12-20 ENCOUNTER — Encounter (HOSPITAL_COMMUNITY): Payer: Self-pay

## 2020-12-20 ENCOUNTER — Ambulatory Visit (HOSPITAL_COMMUNITY): Payer: BC Managed Care – PPO

## 2021-01-16 ENCOUNTER — Inpatient Hospital Stay (HOSPITAL_COMMUNITY)
Admission: AD | Admit: 2021-01-16 | Discharge: 2021-01-16 | Disposition: A | Discharge status: Home / Self Care | Payer: Medicaid Other | Attending: Obstetrics and Gynecology | Admitting: Obstetrics and Gynecology

## 2021-01-16 ENCOUNTER — Encounter (HOSPITAL_COMMUNITY): Payer: Self-pay | Admitting: Obstetrics and Gynecology

## 2021-01-16 ENCOUNTER — Other Ambulatory Visit: Payer: Self-pay

## 2021-01-16 DIAGNOSIS — M62830 Muscle spasm of back: Secondary | ICD-10-CM | POA: Diagnosis not present

## 2021-01-16 DIAGNOSIS — N840 Polyp of corpus uteri: Secondary | ICD-10-CM | POA: Diagnosis not present

## 2021-01-16 DIAGNOSIS — Z3A18 18 weeks gestation of pregnancy: Secondary | ICD-10-CM

## 2021-01-16 DIAGNOSIS — M545 Low back pain, unspecified: Secondary | ICD-10-CM | POA: Diagnosis present

## 2021-01-16 DIAGNOSIS — O468X9 Other antepartum hemorrhage, unspecified trimester: Secondary | ICD-10-CM

## 2021-01-16 DIAGNOSIS — O4692 Antepartum hemorrhage, unspecified, second trimester: Secondary | ICD-10-CM

## 2021-01-16 DIAGNOSIS — O209 Hemorrhage in early pregnancy, unspecified: Secondary | ICD-10-CM | POA: Insufficient documentation

## 2021-01-16 DIAGNOSIS — O99891 Other specified diseases and conditions complicating pregnancy: Secondary | ICD-10-CM | POA: Diagnosis not present

## 2021-01-16 LAB — URINALYSIS, ROUTINE W REFLEX MICROSCOPIC
Bilirubin Urine: NEGATIVE
Glucose, UA: NEGATIVE mg/dL
Ketones, ur: NEGATIVE mg/dL
Nitrite: NEGATIVE
Protein, ur: NEGATIVE mg/dL
Specific Gravity, Urine: 1.002 — ABNORMAL LOW (ref 1.005–1.030)
pH: 6 (ref 5.0–8.0)

## 2021-01-16 MED ORDER — CYCLOBENZAPRINE HCL 10 MG PO TABS: 10.0000 mg | ORAL_TABLET | Freq: Three times a day (TID) | ORAL | 2 refills | Status: DC | PRN

## 2021-01-16 MED ORDER — SILVER NITRATE-POT NITRATE 75-25 % EX MISC
CUTANEOUS | Status: AC
  Filled 2021-01-16: qty 10

## 2021-01-16 MED ORDER — CYCLOBENZAPRINE HCL 5 MG PO TABS
5.0000 mg | ORAL_TABLET | Freq: Once | ORAL | Status: AC
  Administered 2021-01-16: 5 mg via ORAL
  Filled 2021-01-16: qty 1

## 2021-01-16 NOTE — Progress Notes (Signed)
Written and verbal d/c instructions given and understanding voiced. 

## 2021-01-16 NOTE — Discharge Instructions (Signed)
Vaginal Bleeding During Pregnancy, Second Trimester A small amount of bleeding from the vagina is common during pregnancy. This kind of bleeding is also called spotting. Sometimes the bleeding is normal and is not a sign of problems. In some other cases, it is a sign of something serious. In the second trimester, normal bleeding can happen:  Because of changes in your blood vessels.  When you have sex.  When you have pelvic exams. In the second trimester, some abnormal things can cause bleeding. These include:  Infection or swelling.  Growths in the lowest part of the womb (cervix). These growths are also called polyps.  Problems of the placenta. The placenta can block the opening of the cervix. The placenta can also break away from the womb.  Miscarriage.  Early labor.  The cervix that opens early, before labor.  An egg that was not fertilized properly. This causes a type of pregnancy called molar pregnancy. Tell your doctor right away if there is any bleeding from your vagina. Follow these instructions at home: Watch your bleeding  Watch your condition for any changes. Let your doctor know if you are worried about something.  Try to know what causes your bleeding. Ask yourself these questions: ? Does the bleeding start on its own? ? Does the bleeding start after something is done, such as sex or a pelvic exam?  Use a diary to write down the things you see about your bleeding. Write in your diary: ? If the bleeding flows freely without stopping, or if it starts and stops, and then starts again. ? If the bleeding is heavy or light. ? How many pads you use in a day and how much blood is in them.  Tell your doctor if you pass tissue. He or she may want to see it.   Activity  Follow your doctor's instructions about limiting your activities. Ask what activities are safe for you.  Do not exercise or do activities that take a lot of effort until your doctor says that this is  safe.  Do not have sex until your doctor says that this is safe.  Do not lift anything that is heavier than 10 lb (4.5 kg), or the limit that you are told.  If needed, make plans for someone to help with your normal activities. Medicines  Take over-the-counter and prescription medicines only as told by your doctor.  Do not take aspirin. It can cause bleeding. General instructions  Do not use tampons.  Do not douche.  Keep all follow-up visits. Contact a doctor if:  You have bleeding in the vagina at any time during pregnancy.  You have cramps.  You have a fever that does not get better with medicine. Get help right away if:  You have very bad cramps in your back or belly (abdomen).  You have contractions.  You have chills.  Your bleeding gets worse.  You pass large clots or a lot of tissue from your vagina.  You feel light-headed.  You feel weak.  You faint.  You are leaking fluid from your vagina.  You have a gush of fluid from your vagina. Summary  A small amount of bleeding during pregnancy is normal. But bleeding can be a sign of something serious. Tell your doctor right away about any bleeding from your vagina.  Try to know what causes your bleeding. Does the bleeding occur on its own, or does it occur after something is done, such as sex or pelvic exams?  Follow your doctor's instructions about what activities you can do.  Keep all follow-up visits. This information is not intended to replace advice given to you by your health care provider. Make sure you discuss any questions you have with your health care provider. Document Revised: 09/06/2020 Document Reviewed: 09/06/2020 Elsevier Patient Education  2021 [place]. Back Pain in Pregnancy Back pain during pregnancy is common. Back pain may be caused by several factors that are related to changes during your pregnancy. Follow these instructions at home: Managing pain, stiffness, and  swelling  If directed, for sudden (acute) back pain, put ice on the painful area. ? Put ice in a plastic bag. ? Place a towel between your skin and the bag. ? Leave the ice on for 20 minutes, 2-3 times per day.  If directed, apply heat to the affected area before you exercise. Use the heat source that your health care provider recommends, such as a moist heat pack or a heating pad. ? Place a towel between your skin and the heat source. ? Leave the heat on for 20-30 minutes. ? Remove the heat if your skin turns bright red. This is especially important if you are unable to feel pain, heat, or cold. You may have a greater risk of getting burned.  If directed, massage the affected area.      Activity  Exercise as told by your health care provider. Gentle exercise is the best way to prevent or manage back pain.  Listen to your body when lifting. If lifting hurts, ask for help or bend your knees. This uses your leg muscles instead of your back muscles.  Squat down when picking up something from the floor. Do not bend over.  Only use bed rest for short periods as told by your health care provider. Bed rest should only be used for the most severe episodes of back pain. Standing, sitting, and lying down  Do not stand in one place for long periods of time.  Use good posture when sitting. Make sure your head rests over your shoulders and is not hanging forward. Use a pillow on your lower back if necessary.  Try sleeping on your side, preferably the left side, with a pregnancy support pillow or 1-2 regular pillows between your legs. ? If you have back pain after a night's rest, your bed may be too soft. ? A firm mattress may provide more support for your back during pregnancy. General instructions  Do not wear high heels.  Eat a healthy diet. Try to gain weight within your health care provider's recommendations.  Use a maternity girdle, elastic sling, or back brace as told by your health  care provider.  Take over-the-counter and prescription medicines only as told by your health care provider.  Work with a physical therapist or massage therapist to find ways to manage back pain. Acupuncture or massage therapy may be helpful.  Keep all follow-up visits as told by your health care provider. This is important. Contact a health care provider if:  Your back pain interferes with your daily activities.  You have increasing pain in other parts of your body. Get help right away if:  You develop numbness, tingling, weakness, or problems with the use of your arms or legs.  You develop severe back pain that is not controlled with medicine.  You have a change in bowel or bladder control.  You develop shortness of breath, dizziness, or you faint.  You develop nausea, vomiting, or sweating.  You have back pain that is a rhythmic, cramping pain similar to labor pains. Labor pain is usually 1-2 minutes apart, lasts for about 1 minute, and involves a bearing down feeling or pressure in your pelvis.  You have back pain and your water breaks or you have vaginal bleeding.  You have back pain or numbness that travels down your leg.  Your back pain developed after you fell.  You develop pain on one side of your back.  You see blood in your urine.  You develop skin blisters in the area of your back pain. Summary  Back pain may be caused by several factors that are related to changes during your pregnancy.  Follow instructions as told by your health care provider for managing pain, stiffness, and swelling.  Exercise as told by your health care provider. Gentle exercise is the best way to prevent or manage back pain.  Take over-the-counter and prescription medicines only as told by your health care provider.  Keep all follow-up visits as told by your health care provider. This is important. This information is not intended to replace advice given to you by your health care  provider. Make sure you discuss any questions you have with your health care provider. Document Revised: 04/05/2019 Document Reviewed: 06/02/2018 Elsevier Patient Education  [place].

## 2021-01-16 NOTE — MAU Provider Note (Signed)
Chief Complaint:  Vaginal Bleeding and Back Pain   Event Date/Time   First Provider Initiated Contact with Patient 01/16/21 0119     HPI: Ann Schultz is a 25 y.o. G2P0101 at 33w6dwho presents to maternity admissions reporting low back pain for several days and vaginal bleeding with passage of a small clot tonight.  States was told has a polyp on her cervix.  . She denies LOF, vaginal itching/burning, urinary symptoms, h/a, dizziness, n/v, diarrhea, constipation or fever/chills.    Vaginal Bleeding The patient's primary symptoms include vaginal bleeding. The patient's pertinent negatives include no genital itching, genital lesions, genital odor or pelvic pain. This is a recurrent problem. The current episode started today. The problem occurs intermittently. Associated symptoms include back pain. Pertinent negatives include no abdominal pain, chills, dysuria, fever, nausea or vomiting. The vaginal discharge was bloody. The vaginal bleeding is spotting. She has been passing clots. She has not been passing tissue. Nothing aggravates the symptoms. She has tried nothing for the symptoms.  Back Pain This is a recurrent problem. The current episode started in the past 7 days. The problem occurs constantly. The problem is unchanged. The pain is present in the lumbar spine. The quality of the pain is described as aching and cramping. The pain does not radiate. The symptoms are aggravated by lying down, bending and standing. Stiffness is present all day. Pertinent negatives include no abdominal pain, chest pain, dysuria, fever, numbness, paresis or pelvic pain. She has tried nothing for the symptoms.    RN Note: Lower back pain since Sunday. Had a headache that day but that is gone. Tonight went to [facility] and saw a small blood clot. Has a polyp on cervix per pt. No recent intercourse  Past Medical History: Past Medical History:  Diagnosis Date  . Allergy   . Breast fibroadenoma    right    Past  obstetric history: OB History  Gravida Para Term Preterm AB Living  2 1   1   1   SAB IAB Ectopic Multiple Live Births          1    # Outcome Date GA Lbr Len/2nd Weight Sex Delivery Anes PTL Lv  2 Current           1 Preterm 08/03/12 [redacted]w[redacted]d 24:16 / 00:46 1660 g M Vag-Spont None  LIV    Past Surgical History: Past Surgical History:  Procedure Laterality Date  . CYST EXCISION Right December 2012  . EXCISION OF BREAST BIOPSY Left 05/31/2015   Procedure: LEFT EXCISIONAL BREAST BIOPSY;  Surgeon: Stark Klein, MD;  Location: [facility];  Service: General;  Laterality: Left;    Family History: Family History  Problem Relation Age of Onset  . Cancer Maternal Grandmother        breast    Social History: Social History   Tobacco Use  . Smoking status: Never Smoker  . Smokeless tobacco: Never Used  Substance Use Topics  . Alcohol use: No  . Drug use: No    Allergies: No Known Allergies  Meds:  Medications Prior to Admission  Medication Sig Dispense Refill Last Dose  . pantoprazole (PROTONIX) 20 MG tablet Take 1 tablet (20 mg total) by mouth 2 (two) times daily. 30 tablet 0   . Prenatal Vit-Fe Fumarate-FA (PRENATAL MULTIVITAMIN) TABS tablet Take 1 tablet by mouth daily at 12 noon.       I have reviewed patient's Past Medical Hx, Surgical Hx, Family Hx, Social Hx, medications  and allergies.   ROS:  Review of Systems  Constitutional: Negative for chills and fever.  Cardiovascular: Negative for chest pain.  Gastrointestinal: Negative for abdominal pain, nausea and vomiting.  Genitourinary: Positive for vaginal bleeding. Negative for dysuria and pelvic pain.  Musculoskeletal: Positive for back pain.  Neurological: Negative for numbness.   Other systems negative  Physical Exam   Patient Vitals for the past 24 hrs:  BP Temp Pulse Resp Height Weight  01/16/21 0042 123/71 98.8 F (37.1 C) (!) 101 16 5\' 6"  (1.676 m) 100.2 kg   Constitutional: Well-developed, well-nourished  female in no acute distress.  Cardiovascular: normal rate and rhythm Respiratory: normal effort, clear to auscultation bilaterally GI: Abd soft, non-tender, gravid appropriate for gestational age.   No rebound or guarding. MS: Extremities nontender, no edema, normal ROM   Lumbar spine with generalized tenderness and spasm.  Neurologic: Alert and oriented x 4.  GU: Neg CVAT.  PELVIC EXAM: Cervix pink, visually closed, with several polypoid lesion vs possible Nabothian cysts, and scant pink discharge  :   FHT:  146   Labs: Results for orders placed or performed during the hospital encounter of 01/16/21 (from the past 24 hour(s))  Urinalysis, Routine w reflex microscopic Urine, Clean Catch     Status: Abnormal   Collection Time: 01/16/21 12:42 AM  Result Value Ref Range   Color, Urine YELLOW YELLOW   APPearance CLOUDY (A) CLEAR   Specific Gravity, Urine 1.002 (L) 1.005 - 1.030   pH 6.0 5.0 - 8.0   Glucose, UA NEGATIVE NEGATIVE mg/dL   Hgb urine dipstick LARGE (A) NEGATIVE   Bilirubin Urine NEGATIVE NEGATIVE   Ketones, ur NEGATIVE NEGATIVE mg/dL   Protein, ur NEGATIVE NEGATIVE mg/dL   Nitrite NEGATIVE NEGATIVE   Leukocytes,Ua LARGE (A) NEGATIVE   RBC / HPF 11-20 0 - 5 RBC/hpf   WBC, UA 11-20 0 - 5 WBC/hpf   Bacteria, UA RARE (A) NONE SEEN   Squamous Epithelial / LPF 11-20 0 - 5   Mucus PRESENT      Imaging:  No results found.  MAU Course/MDM: I have ordered labs and reviewed results. UA is clear but contaminated   Treatments in MAU included Flexeril which relieved back pain almost entirely  Cervix visualized on speculum   Scattered lesions noted on cervix, oozing blood, friable to light touch.  Discussed expectant management with patient but offered trial of silver nitrate which she agreed to.  Each lesion touched by Silver nitrate sticks in small amounts.  There was cessation of oozing at contact.  Care was taken to touch only center of lesion, no contact with surrounding  cervix. Discussed she may see red/dark discharge until these areas heal over.   Assessment: Single IUP at [redacted]w[redacted]d Vaginal bleeding secondary to multiple friable polypoid lesions  Low back pain related to spasm, relieved by half dose of Flexeril   Plan: Discharge home Pelvic rest Bleeding precautions Rx Flexeril for back spasms, recommend using half dose first Preterm Labor precautions and fetal kick counts Follow up in Office for prenatal visits   Encouraged to return if she develops worsening of symptoms, increase in pain, fever, or other concerning symptoms.   Pt stable at time of discharge.  Hansel Feinstein CNM, MSN Certified Nurse-Midwife 01/16/2021 1:19 AM

## 2021-01-16 NOTE — MAU Note (Signed)
Lower back pain since Sunday. Had a headache that day but that is gone. Tonight went to [facility] and saw a small blood clot. Has a polyp on cervix per pt. No recent intercourse

## 2021-01-16 NOTE — Progress Notes (Signed)
Silver Nitrate used on cervical polyps

## 2021-02-05 ENCOUNTER — Ambulatory Visit (HOSPITAL_COMMUNITY): Payer: Medicaid Other | Attending: Obstetrics and Gynecology

## 2021-02-05 ENCOUNTER — Encounter (HOSPITAL_COMMUNITY): Payer: Self-pay

## 2021-02-12 ENCOUNTER — Encounter (HOSPITAL_COMMUNITY): Payer: Self-pay

## 2021-02-12 ENCOUNTER — Ambulatory Visit (HOSPITAL_COMMUNITY): Admission: RE | Admit: 2021-02-12 | Payer: Medicaid Other | Source: Ambulatory Visit

## 2021-03-29 ENCOUNTER — Encounter (HOSPITAL_COMMUNITY): Payer: Self-pay | Admitting: Obstetrics & Gynecology

## 2021-03-29 ENCOUNTER — Inpatient Hospital Stay (HOSPITAL_COMMUNITY)
Admission: AD | Admit: 2021-03-29 | Discharge: 2021-03-29 | Disposition: A | Discharge status: Home / Self Care | Payer: Medicaid Other | Attending: Obstetrics & Gynecology | Admitting: Obstetrics & Gynecology

## 2021-03-29 ENCOUNTER — Other Ambulatory Visit: Payer: Self-pay

## 2021-03-29 DIAGNOSIS — R1084 Generalized abdominal pain: Secondary | ICD-10-CM

## 2021-03-29 DIAGNOSIS — Z3A29 29 weeks gestation of pregnancy: Secondary | ICD-10-CM | POA: Diagnosis not present

## 2021-03-29 DIAGNOSIS — B3731 Acute candidiasis of vulva and vagina: Secondary | ICD-10-CM

## 2021-03-29 DIAGNOSIS — Z79899 Other long term (current) drug therapy: Secondary | ICD-10-CM | POA: Insufficient documentation

## 2021-03-29 DIAGNOSIS — R109 Unspecified abdominal pain: Secondary | ICD-10-CM | POA: Diagnosis not present

## 2021-03-29 DIAGNOSIS — O26893 Other specified pregnancy related conditions, third trimester: Secondary | ICD-10-CM | POA: Insufficient documentation

## 2021-03-29 DIAGNOSIS — Z0371 Encounter for suspected problem with amniotic cavity and membrane ruled out: Secondary | ICD-10-CM | POA: Insufficient documentation

## 2021-03-29 DIAGNOSIS — O212 Late vomiting of pregnancy: Secondary | ICD-10-CM | POA: Diagnosis not present

## 2021-03-29 DIAGNOSIS — O98813 Other maternal infectious and parasitic diseases complicating pregnancy, third trimester: Secondary | ICD-10-CM | POA: Diagnosis not present

## 2021-03-29 DIAGNOSIS — B373 Candidiasis of vulva and vagina: Secondary | ICD-10-CM | POA: Diagnosis not present

## 2021-03-29 DIAGNOSIS — O23593 Infection of other part of genital tract in pregnancy, third trimester: Secondary | ICD-10-CM | POA: Diagnosis not present

## 2021-03-29 LAB — WET PREP, GENITAL
Sperm: NONE SEEN
Trich, Wet Prep: NONE SEEN

## 2021-03-29 LAB — URINALYSIS, ROUTINE W REFLEX MICROSCOPIC
Bilirubin Urine: NEGATIVE
Glucose, UA: NEGATIVE mg/dL
Ketones, ur: NEGATIVE mg/dL
Nitrite: NEGATIVE
Protein, ur: NEGATIVE mg/dL
Specific Gravity, Urine: 1.004 — ABNORMAL LOW (ref 1.005–1.030)
pH: 7 (ref 5.0–8.0)

## 2021-03-29 LAB — AMNISURE RUPTURE OF MEMBRANE (ROM) NOT AT ARMC: Amnisure ROM: NEGATIVE

## 2021-03-29 LAB — FETAL FIBRONECTIN: Fetal Fibronectin: NEGATIVE

## 2021-03-29 MED ORDER — TERCONAZOLE 0.4 % VA CREA: 1.0000 | TOPICAL_CREAM | Freq: Every day | VAGINAL | 0 refills | Status: DC

## 2021-03-29 MED ORDER — ACETAMINOPHEN 500 MG PO TABS
1000.0000 mg | ORAL_TABLET | Freq: Once | ORAL | Status: AC
  Administered 2021-03-29: 1000 mg via ORAL
  Filled 2021-03-29: qty 2

## 2021-03-29 NOTE — MAU Note (Signed)
Ann Schultz is a 25 y.o. at [redacted]w[redacted]d here in MAU reporting: ongoing this week has been having some cramping, dizziness, nausea, and LOF. States earlier this week fluid was more watery and was clear but today it is a white discharge. Cramping comes about every 15 minutes. No bleeding. +FM  Onset of complaint: ongoing  Pain score: 4/10  Vitals:   03/29/21 0748  BP: 109/82  Pulse: (!) 107  Resp: 16  Temp: 98.3 F (36.8 C)  SpO2: 98%     FHT:158  Lab orders placed from triage: UA

## 2021-03-29 NOTE — MAU Provider Note (Signed)
History     CSN: [id number]  Arrival date and time: 03/29/21 8416   Event Date/Time   First Provider Initiated Contact with Patient 03/29/21 [phone]      Chief Complaint  Patient presents with  . Abdominal Pain  . Vaginal Discharge  . Nausea  . Dizziness   HPI  Ms.Ann Schultz is a 25 y.o. female G27P0101 @ [redacted]w[redacted]d here in MAU with multiple complaints including abdominal pain, vaginal discharge, and nausea. First baby was born at 15 weeks; she did not know she was pregnant. She showed up at the hospital and was 10 cm. She reports discharge that waxes and wanes from white to clear discharge.  The pain is located in her lower abdomen. The pain comes and goes. She currently rates her pain 4/10. She has not taken anything for the pain.   OB History    Gravida  2   Para  1   Term      Preterm  1   AB      Living  1     SAB      IAB      Ectopic      Multiple      Live Births  1           Past Medical History:  Diagnosis Date  . Allergy   . Breast fibroadenoma    right    Past Surgical History:  Procedure Laterality Date  . CYST EXCISION Right December 2012  . EXCISION OF BREAST BIOPSY Left 05/31/2015   Procedure: LEFT EXCISIONAL BREAST BIOPSY;  Surgeon: Stark Klein, MD;  Location: [facility];  Service: General;  Laterality: Left;    Family History  Problem Relation Age of Onset  . Cancer Maternal Grandmother        breast    Social History   Tobacco Use  . Smoking status: Never Smoker  . Smokeless tobacco: Never Used  Vaping Use  . Vaping Use: Never used  Substance Use Topics  . Alcohol use: No  . Drug use: No    Allergies: No Known Allergies  Medications Prior to Admission  Medication Sig Dispense Refill Last Dose  . Prenatal Vit-Fe Fumarate-FA (PRENATAL MULTIVITAMIN) TABS tablet Take 1 tablet by mouth daily at 12 noon.   03/28/2021 at Unknown time  . cyclobenzaprine (FLEXERIL) 10 MG tablet Take 1 tablet (10 mg total) by mouth every 8 (eight)  hours as needed for muscle spasms. 30 tablet 2 More than a month at Unknown time  . pantoprazole (PROTONIX) 20 MG tablet Take 1 tablet (20 mg total) by mouth 2 (two) times daily. 30 tablet 0 Unknown at Unknown time   Results for orders placed or performed during the hospital encounter of 03/29/21 (from the past 72 hour(s))  Amnisure rupture of membrane (rom)not at [facility]     Status: None   Collection Time: 03/29/21  8:22 AM  Result Value Ref Range   Amnisure ROM NEGATIVE     Comment: Performed at [facility], [address] [place],  [address]  Wet prep, genital     Status: Abnormal   Collection Time: 03/29/21  8:39 AM  Result Value Ref Range   Yeast Wet Prep HPF POC PRESENT (A) NONE SEEN   Trich, Wet Prep NONE SEEN NONE SEEN   Clue Cells Wet Prep HPF POC PRESENT (A) NONE SEEN   WBC, Wet Prep HPF POC MANY (A) NONE SEEN   Sperm NONE  SEEN     Comment: Performed at [facility], [place] [address], [place], [place] [address]  GC/Chlamydia probe amp ([place])not at [facility]     Status: None   Collection Time: 03/29/21  8:39 AM  Result Value Ref Range   Neisseria Gonorrhea Negative    Chlamydia Negative    Comment Normal Reference Ranger Chlamydia - Negative    Comment      Normal Reference Range Neisseria Gonorrhea - Negative  Fetal fibronectin     Status: None   Collection Time: 03/29/21  8:39 AM  Result Value Ref Range   Fetal Fibronectin NEGATIVE NEGATIVE    Comment: Performed at [facility], [place] [address], [place], [place] [address]  OB Urine Culture     Status: Abnormal   Collection Time: 03/29/21 10:30 AM   Specimen: Urine, Random  Result Value Ref Range   Specimen Description URINE, RANDOM    Special Requests NONE    Culture (A)     MULTIPLE SPECIES PRESENT, SUGGEST RECOLLECTION NO GROUP B STREP (S.AGALACTIAE) ISOLATED Performed at [facility] [address], [place], [place] [address]    Report Status 03/30/2021 FINAL     Review of  Systems  Constitutional: Negative for fever.  Gastrointestinal: Positive for abdominal pain and nausea. Negative for vomiting.  Genitourinary: Positive for vaginal discharge. Negative for vaginal bleeding.   Physical Exam   Blood pressure 137/80, pulse (!) 103, temperature 98.3 F (36.8 C), temperature source Oral, resp. rate 18, height 5\' 6"  (1.676 m), weight 105.7 kg, last menstrual period 09/06/2020, SpO2 99 %.  Physical Exam Vitals and nursing note reviewed.  Constitutional:      Appearance: She is well-developed.  Abdominal:     Tenderness: There is no abdominal tenderness.  Genitourinary:    Comments: Vagina - Small amount of white vaginal discharge consistent with yeast, no odor, no pooling.  Cervix - No contact bleeding, no active bleeding  Bimanual exam: Cervix closed, thick, long  GC/Chlam, wet prep done, amnisure, FFN Chaperone present for exam.  Skin:    General: Skin is warm.  Neurological:     Mental Status: She is alert and oriented to person, place, and time.  Psychiatric:        Behavior: Behavior normal.    Fetal Tracing: Baseline: 125 bpm   Variability: moderate  Accelerations: 15x15 Decelerations: None Toco: quiet   MAU Course  Procedures  None  MDM  Wet prep and GC collected Fern negative FFN negative amnisure negative Reviewed results in detail with the patient.   Assessment and Plan   A:  1. Encounter for suspected premature rupture of amniotic membranes, with rupture of membranes not found   2. Yeast vaginitis   3. [redacted] weeks gestation of pregnancy   4. Abdominal cramping, generalized     P:  Discharge home in stable condition Rx: Terazol Return to MAU if symptoms worsen Continue prenatal care Ok to use tylenol as directed on the bottle.   Noni Saupe I, NP 04/01/2021 8:24 AM

## 2021-03-29 NOTE — Discharge Instructions (Signed)

## 2021-03-30 LAB — CULTURE, OB URINE

## 2021-03-31 LAB — GC/CHLAMYDIA PROBE AMP (~~LOC~~) NOT AT ARMC
Chlamydia: NEGATIVE
Comment: NEGATIVE
Comment: NORMAL
Neisseria Gonorrhea: NEGATIVE

## 2021-04-01 ENCOUNTER — Other Ambulatory Visit: Payer: Self-pay | Admitting: Obstetrics and Gynecology

## 2021-04-01 MED ORDER — METRONIDAZOLE 500 MG PO TABS
500.0000 mg | ORAL_TABLET | Freq: Two times a day (BID) | ORAL | 0 refills | Status: AC
Start: 2021-04-01 — End: 2021-04-08

## 2021-04-01 NOTE — Progress Notes (Signed)
+   clue cells on wet prep Rx: flagyl. Patient called and notified.    Noni Saupe I, NP 04/01/2021 8:25 AM.

## 2021-04-04 ENCOUNTER — Other Ambulatory Visit: Payer: Self-pay | Admitting: Obstetrics and Gynecology

## 2021-04-04 ENCOUNTER — Other Ambulatory Visit (HOSPITAL_COMMUNITY): Payer: Self-pay | Admitting: Obstetrics and Gynecology

## 2021-04-04 DIAGNOSIS — E049 Nontoxic goiter, unspecified: Secondary | ICD-10-CM

## 2021-04-12 ENCOUNTER — Encounter (HOSPITAL_COMMUNITY): Payer: Self-pay

## 2021-04-12 ENCOUNTER — Ambulatory Visit (HOSPITAL_COMMUNITY): Admission: RE | Admit: 2021-04-12 | Payer: Medicaid Other | Source: Ambulatory Visit

## 2021-04-29 ENCOUNTER — Encounter (HOSPITAL_COMMUNITY): Payer: Self-pay | Admitting: Obstetrics and Gynecology

## 2021-04-29 ENCOUNTER — Inpatient Hospital Stay (HOSPITAL_COMMUNITY)
Admission: AD | Admit: 2021-04-29 | Discharge: 2021-04-30 | Disposition: A | Discharge status: Home / Self Care | Payer: Medicaid Other | Attending: Obstetrics and Gynecology | Admitting: Obstetrics and Gynecology

## 2021-04-29 ENCOUNTER — Other Ambulatory Visit: Payer: Self-pay

## 2021-04-29 DIAGNOSIS — Z3A33 33 weeks gestation of pregnancy: Secondary | ICD-10-CM | POA: Diagnosis not present

## 2021-04-29 DIAGNOSIS — M549 Dorsalgia, unspecified: Secondary | ICD-10-CM | POA: Insufficient documentation

## 2021-04-29 DIAGNOSIS — B373 Candidiasis of vulva and vagina: Secondary | ICD-10-CM

## 2021-04-29 DIAGNOSIS — Z3689 Encounter for other specified antenatal screening: Secondary | ICD-10-CM

## 2021-04-29 DIAGNOSIS — O26893 Other specified pregnancy related conditions, third trimester: Secondary | ICD-10-CM | POA: Diagnosis not present

## 2021-04-29 DIAGNOSIS — O98819 Other maternal infectious and parasitic diseases complicating pregnancy, unspecified trimester: Secondary | ICD-10-CM

## 2021-04-29 DIAGNOSIS — O99891 Other specified diseases and conditions complicating pregnancy: Secondary | ICD-10-CM | POA: Diagnosis not present

## 2021-04-29 LAB — WET PREP, GENITAL
Clue Cells Wet Prep HPF POC: NONE SEEN
Sperm: NONE SEEN
Trich, Wet Prep: NONE SEEN

## 2021-04-29 LAB — URINALYSIS, ROUTINE W REFLEX MICROSCOPIC
Bilirubin Urine: NEGATIVE
Glucose, UA: NEGATIVE mg/dL
Hgb urine dipstick: NEGATIVE
Ketones, ur: NEGATIVE mg/dL
Nitrite: NEGATIVE
Protein, ur: NEGATIVE mg/dL
Specific Gravity, Urine: 1.008 (ref 1.005–1.030)
pH: 6 (ref 5.0–8.0)

## 2021-04-29 MED ORDER — ACETAMINOPHEN 500 MG PO TABS
1000.0000 mg | ORAL_TABLET | Freq: Once | ORAL | Status: AC
  Administered 2021-04-29: 1000 mg via ORAL
  Filled 2021-04-29: qty 2

## 2021-04-29 MED ORDER — TERCONAZOLE 0.4 % VA CREA: 1.0000 | TOPICAL_CREAM | Freq: Every day | VAGINAL | 0 refills | Status: DC

## 2021-04-29 MED ORDER — CYCLOBENZAPRINE HCL 5 MG PO TABS
10.0000 mg | ORAL_TABLET | Freq: Once | ORAL | Status: AC
  Administered 2021-04-29: 10 mg via ORAL
  Filled 2021-04-29: qty 2

## 2021-04-29 NOTE — MAU Provider Note (Signed)
History     CSN: [id number]  Arrival date and time: 04/29/21 2125   Event Date/Time   First Provider Initiated Contact with Patient 04/29/21 2232      Chief Complaint  Patient presents with  . Back Pain  . Pelvic Pain   Ann Schultz is a 25 y.o. G2P0101 at [redacted]w[redacted]d who receives care at [facility].  She presents today for Back Pain and Pelvic Pain.  Patient states that today around 3pm she had a "lightening pain" that "went down my body."  She states the feelings is constant and makes her feel like she has to defecate.  She states the pain is located primarily in her "butt cheeks" and goes on to state that "it feels like pressure."  Patient  states she had similar symptoms when she went into labor with her first son at 51 weeks. She denies abdominal cramping, contractions, or pain.  She rates the pain a 8/10. She states the pain has no known aggravating or relieving factors. She also reports back pain that is intermittent and located in her lower back. She endorses fetal movement.  She denies vaginal discharge, bleeding, or leaking of fluid.  She appears uncomfortable and shifts positions frequently while speaking to provider.    OB History    Gravida  2   Para  1   Term      Preterm  1   AB      Living  1     SAB      IAB      Ectopic      Multiple      Live Births  1           Past Medical History:  Diagnosis Date  . Allergy   . Breast fibroadenoma    right    Past Surgical History:  Procedure Laterality Date  . CYST EXCISION Right December 2012  . EXCISION OF BREAST BIOPSY Left 05/31/2015   Procedure: LEFT EXCISIONAL BREAST BIOPSY;  Surgeon: Stark Klein, MD;  Location: [facility];  Service: General;  Laterality: Left;    Family History  Problem Relation Age of Onset  . Cancer Maternal Grandmother        breast    Social History   Tobacco Use  . Smoking status: Never Smoker  . Smokeless tobacco: Never Used  Vaping Use  . Vaping Use: Never  used  Substance Use Topics  . Alcohol use: No  . Drug use: No    Allergies: No Known Allergies  Medications Prior to Admission  Medication Sig Dispense Refill Last Dose  . cyclobenzaprine (FLEXERIL) 10 MG tablet Take 1 tablet (10 mg total) by mouth every 8 (eight) hours as needed for muscle spasms. 30 tablet 2 Past Week at Unknown time  . pantoprazole (PROTONIX) 20 MG tablet Take 1 tablet (20 mg total) by mouth 2 (two) times daily. 30 tablet 0 Past Week at Unknown time  . Prenatal Vit-Fe Fumarate-FA (PRENATAL MULTIVITAMIN) TABS tablet Take 1 tablet by mouth daily at 12 noon.   04/29/2021 at Unknown time  . terconazole (TERAZOL 7) 0.4 % vaginal cream Place 1 applicator vaginally at bedtime. 45 g 0     Review of Systems  Constitutional: Negative for chills and fever.  Respiratory: Negative for cough and shortness of breath.   Gastrointestinal: Positive for diarrhea (Loose stool daily since pregnancy). Negative for abdominal pain, nausea and vomiting.  Genitourinary: Negative for difficulty urinating, dyspareunia, dysuria, vaginal  bleeding and vaginal discharge.  Musculoskeletal: Positive for back pain.  Neurological: Negative for dizziness, light-headedness and headaches.   Physical Exam   Blood pressure 112/77, pulse 100, temperature 98.9 F (37.2 C), temperature source Oral, resp. rate 17, height 5\' 6"  (1.676 m), last menstrual period 09/06/2020, SpO2 100 %.  Physical Exam Vitals reviewed. Exam conducted with a chaperone present.  Constitutional:      Appearance: Normal appearance.  HENT:     Head: Normocephalic and atraumatic.  Eyes:     Conjunctiva/sclera: Conjunctivae normal.  Cardiovascular:     Rate and Rhythm: Normal rate and regular rhythm.     Heart sounds: Normal heart sounds.  Pulmonary:     Effort: Pulmonary effort is normal. No respiratory distress.     Breath sounds: Normal breath sounds.  Abdominal:     General: Bowel sounds are normal.  Genitourinary:     Comments: Speculum Exam: -Normal External Genitalia: Non tender, no apparent discharge at introitus.  -Vaginal Vault: Pink mucosa with good rugae-white plaques noted on walls. Small amt thin white curdy discharge.  -wet prep collected -Cervix:Pink, no lesions, cysts, or polyps. White plaques noted on anterior aspect. Appears closed. No active bleeding from os-GC/CT collected -Bimanual Exam:  External os 1cm, Internal closed. No presenting parts noted in pelvis.   Musculoskeletal:        General: Normal range of motion.     Cervical back: Normal range of motion.  Skin:    General: Skin is warm and dry.  Neurological:     Mental Status: She is alert and oriented to person, place, and time.  Psychiatric:        Mood and Affect: Mood normal.        Behavior: Behavior normal.        Thought Content: Thought content normal.     Fetal Assessment 135 bpm, Mod Var, -Decels, +Accels Toco: No ctx graphed  MAU Course   Results for orders placed or performed during the hospital encounter of 04/29/21 (from the past 24 hour(s))  Urinalysis, Routine w reflex microscopic Urine, Clean Catch     Status: Abnormal   Collection Time: 04/29/21 10:10 PM  Result Value Ref Range   Color, Urine YELLOW YELLOW   APPearance HAZY (A) CLEAR   Specific Gravity, Urine 1.008 1.005 - 1.030   pH 6.0 5.0 - 8.0   Glucose, UA NEGATIVE NEGATIVE mg/dL   Hgb urine dipstick NEGATIVE NEGATIVE   Bilirubin Urine NEGATIVE NEGATIVE   Ketones, ur NEGATIVE NEGATIVE mg/dL   Protein, ur NEGATIVE NEGATIVE mg/dL   Nitrite NEGATIVE NEGATIVE   Leukocytes,Ua LARGE (A) NEGATIVE   RBC / HPF 0-5 0 - 5 RBC/hpf   WBC, UA 11-20 0 - 5 WBC/hpf   Bacteria, UA RARE (A) NONE SEEN   Squamous Epithelial / LPF 6-10 0 - 5  Wet prep, genital     Status: Abnormal   Collection Time: 04/29/21 10:54 PM  Result Value Ref Range   Yeast Wet Prep HPF POC PRESENT (A) NONE SEEN   Trich, Wet Prep NONE SEEN NONE SEEN   Clue Cells Wet Prep HPF POC NONE  SEEN NONE SEEN   WBC, Wet Prep HPF POC MANY (A) NONE SEEN   Sperm NONE SEEN    No results found.  MDM PE Labs: EFM Muscle Relaxant Analgesic Assessment and Plan  25 year old G2P0101  SIUP at 33.4 weeks Cat I FT Back Pain H/O PTL   -POC Reviewed -Discussed possible  causes of  -Exam performed and findings discussed. -Informed that discharge suggestive of yeast.  Will treat. -Educated on how yeast can, in pregnancy, give perception of vaginal pressure.  -Reviewed c/o back pain and treatment.   -Patient states she has flexeril at home that works, but did not use wit onset of pain -Discussed usage for prn and in conjunction with tylenol.  -Patient offered and accepts flexeril now.  -NST Reactive. -Will give flexeril and reassess.  Maryann Conners MSN, CNM 04/29/2021, 10:32 PM   Reassessment (11:32 PM)  -Patient reports improvement with flexeril dosing. -Questions what she should do at home as she has fear about addiction with continuous flexeril usage. -Discussed options of taking flexeril qod or at bedtime to help promote comfort and improve sleep. Also discussed using half dose. -Reviewed other options for back pain relief including warm compresses and massages. -Patient reports she does not require additional flexeril script. -Encouraged to call or return to MAU if symptoms worsen or with the onset of new symptoms. -Discharged to home in stable condition.  Maryann Conners MSN, CNM Advanced Practice Provider, [facility]

## 2021-04-29 NOTE — Discharge Instructions (Signed)
Back Pain in Pregnancy Back pain during pregnancy is common. Back pain may be caused by several factors that are related to changes during your pregnancy. Follow these instructions at home: Managing pain, stiffness, and swelling  If directed, for sudden (acute) back pain, put ice on the painful area. ? Put ice in a plastic bag. ? Place a towel between your skin and the bag. ? Leave the ice on for 20 minutes, 2-3 times per day.  If directed, apply heat to the affected area before you exercise. Use the heat source that your health care provider recommends, such as a moist heat pack or a heating pad. ? Place a towel between your skin and the heat source. ? Leave the heat on for 20-30 minutes. ? Remove the heat if your skin turns bright red. This is especially important if you are unable to feel pain, heat, or cold. You may have a greater risk of getting burned.  If directed, massage the affected area.      Activity  Exercise as told by your health care provider. Gentle exercise is the best way to prevent or manage back pain.  Listen to your body when lifting. If lifting hurts, ask for help or bend your knees. This uses your leg muscles instead of your back muscles.  Squat down when picking up something from the floor. Do not bend over.  Only use bed rest for short periods as told by your health care provider. Bed rest should only be used for the most severe episodes of back pain. Standing, sitting, and lying down  Do not stand in one place for long periods of time.  Use good posture when sitting. Make sure your head rests over your shoulders and is not hanging forward. Use a pillow on your lower back if necessary.  Try sleeping on your side, preferably the left side, with a pregnancy support pillow or 1-2 regular pillows between your legs. ? If you have back pain after a night's rest, your bed may be too soft. ? A firm mattress may provide more support for your back during  pregnancy. General instructions  Do not wear high heels.  Eat a healthy diet. Try to gain weight within your health care provider's recommendations.  Use a maternity girdle, elastic sling, or back brace as told by your health care provider.  Take over-the-counter and prescription medicines only as told by your health care provider.  Work with a physical therapist or massage therapist to find ways to manage back pain. Acupuncture or massage therapy may be helpful.  Keep all follow-up visits as told by your health care provider. This is important. Contact a health care provider if:  Your back pain interferes with your daily activities.  You have increasing pain in other parts of your body. Get help right away if:  You develop numbness, tingling, weakness, or problems with the use of your arms or legs.  You develop severe back pain that is not controlled with medicine.  You have a change in bowel or bladder control.  You develop shortness of breath, dizziness, or you faint.  You develop nausea, vomiting, or sweating.  You have back pain that is a rhythmic, cramping pain similar to labor pains. Labor pain is usually 1-2 minutes apart, lasts for about 1 minute, and involves a bearing down feeling or pressure in your pelvis.  You have back pain and your water breaks or you have vaginal bleeding.  You have back pain or  numbness that travels down your leg.  Your back pain developed after you fell.  You develop pain on one side of your back.  You see blood in your urine.  You develop skin blisters in the area of your back pain. Summary  Back pain may be caused by several factors that are related to changes during your pregnancy.  Follow instructions as told by your health care provider for managing pain, stiffness, and swelling.  Exercise as told by your health care provider. Gentle exercise is the best way to prevent or manage back pain.  Take over-the-counter and  prescription medicines only as told by your health care provider.  Keep all follow-up visits as told by your health care provider. This is important. This information is not intended to replace advice given to you by your health care provider. Make sure you discuss any questions you have with your health care provider. Document Revised: 04/05/2019 Document Reviewed: 06/02/2018 Elsevier Patient Education  2021 Elsevier Inc.  

## 2021-04-29 NOTE — MAU Note (Signed)
Patient reports to MAU complaining of a lightening pain that goes her bottom, throbbing pelvic pain that is constant, upper and lower back pain that is also constant. Denies leaking of fluid, or vaginal bleeding. +FM.  Pain score:  Bottom: 8/10 Pelvic pain: 8/10 Back pain: 7/10

## 2021-04-30 LAB — GC/CHLAMYDIA PROBE AMP (~~LOC~~) NOT AT ARMC
Chlamydia: NEGATIVE
Comment: NEGATIVE
Comment: NORMAL
Neisseria Gonorrhea: NEGATIVE

## 2021-05-09 ENCOUNTER — Other Ambulatory Visit: Payer: Self-pay

## 2021-05-09 ENCOUNTER — Ambulatory Visit (HOSPITAL_COMMUNITY)
Admission: RE | Admit: 2021-05-09 | Discharge: 2021-05-09 | Disposition: A | Discharge status: Home / Self Care | Payer: Medicaid Other | Source: Ambulatory Visit | Attending: Obstetrics and Gynecology | Admitting: Obstetrics and Gynecology

## 2021-05-09 ENCOUNTER — Other Ambulatory Visit (HOSPITAL_COMMUNITY): Payer: Medicaid Other

## 2021-05-09 ENCOUNTER — Encounter (HOSPITAL_COMMUNITY): Payer: Self-pay

## 2021-05-09 DIAGNOSIS — E049 Nontoxic goiter, unspecified: Secondary | ICD-10-CM | POA: Diagnosis not present

## 2021-05-16 LAB — OB RESULTS CONSOLE GBS: GBS: NEGATIVE

## 2021-05-22 ENCOUNTER — Inpatient Hospital Stay (HOSPITAL_COMMUNITY)
Admission: AD | Admit: 2021-05-22 | Discharge: 2021-05-22 | Disposition: A | Discharge status: Home / Self Care | Payer: Medicaid Other | Attending: Obstetrics and Gynecology | Admitting: Obstetrics and Gynecology

## 2021-05-22 ENCOUNTER — Other Ambulatory Visit: Payer: Self-pay

## 2021-05-22 ENCOUNTER — Encounter (HOSPITAL_COMMUNITY): Payer: Self-pay | Admitting: Obstetrics and Gynecology

## 2021-05-22 DIAGNOSIS — R0789 Other chest pain: Secondary | ICD-10-CM | POA: Diagnosis not present

## 2021-05-22 DIAGNOSIS — O26893 Other specified pregnancy related conditions, third trimester: Secondary | ICD-10-CM | POA: Diagnosis not present

## 2021-05-22 DIAGNOSIS — R102 Pelvic and perineal pain: Secondary | ICD-10-CM | POA: Diagnosis not present

## 2021-05-22 DIAGNOSIS — Z3A36 36 weeks gestation of pregnancy: Secondary | ICD-10-CM | POA: Diagnosis not present

## 2021-05-22 DIAGNOSIS — R079 Chest pain, unspecified: Secondary | ICD-10-CM | POA: Insufficient documentation

## 2021-05-22 DIAGNOSIS — O99891 Other specified diseases and conditions complicating pregnancy: Secondary | ICD-10-CM

## 2021-05-22 LAB — URINALYSIS, ROUTINE W REFLEX MICROSCOPIC
Bilirubin Urine: NEGATIVE
Glucose, UA: NEGATIVE mg/dL
Ketones, ur: NEGATIVE mg/dL
Nitrite: NEGATIVE
Protein, ur: NEGATIVE mg/dL
Specific Gravity, Urine: 1.006 (ref 1.005–1.030)
Squamous Epithelial / HPF: >50 — ABNORMAL HIGH (ref 0–5)
pH: 7 (ref 5.0–8.0)

## 2021-05-22 MED ORDER — CYCLOBENZAPRINE HCL 10 MG PO TABS: 10.0000 mg | ORAL_TABLET | Freq: Three times a day (TID) | ORAL | 0 refills | Status: DC | PRN

## 2021-05-22 MED ORDER — CYCLOBENZAPRINE HCL 5 MG PO TABS: 10.0000 mg | ORAL_TABLET | Freq: Once | ORAL | Status: DC

## 2021-05-22 MED ORDER — ACETAMINOPHEN 500 MG PO TABS
1000.0000 mg | ORAL_TABLET | Freq: Once | ORAL | Status: AC
  Administered 2021-05-22: 1000 mg via ORAL
  Filled 2021-05-22: qty 2

## 2021-05-22 NOTE — MAU Note (Signed)
Having pain in L upper chest that is cramping-like and intermittent. Started last night. Tylenol helps some and then it comes back. R hand was numb that lasted about 11mins and went away. Hand allittle tingling this am. Having some pain in lower abd that is worse when I walk. Currently using vag cream for yeast. Denies LOF or VB.

## 2021-05-22 NOTE — MAU Provider Note (Signed)
History     CSN: [id number]  Arrival date and time: 05/22/21 8546   Event Date/Time   First Provider Initiated Contact with Patient 05/22/21 2004      Chief Complaint  Patient presents with  . Chest Pain  . Numbness   HPI Ann Schultz is a 25 y.o. G2P0101 at [redacted]w[redacted]d who presents with chest tightness. She states it is intermittent and relieved with tylenol. Sometimes she also has arm numbness on the right side. She reports the tightness feels like a cramp. She denies any symptoms at this time. She also reports "unbearable" pelvic pressure like something is "sitting in my pelvis." She states she was 1cm in the office last week and her OB told her he thought the baby would come last weekend. She denies any contractions, leaking or bleeding. She reports normal movement.   OB History    Gravida  2   Para  1   Term      Preterm  1   AB      Living  1     SAB      IAB      Ectopic      Multiple      Live Births  1           Past Medical History:  Diagnosis Date  . Allergy   . Breast fibroadenoma    right    Past Surgical History:  Procedure Laterality Date  . CYST EXCISION Right December 2012  . EXCISION OF BREAST BIOPSY Left 05/31/2015   Procedure: LEFT EXCISIONAL BREAST BIOPSY;  Surgeon: Stark Klein, MD;  Location: [facility];  Service: General;  Laterality: Left;    Family History  Problem Relation Age of Onset  . Cancer Maternal Grandmother        breast    Social History   Tobacco Use  . Smoking status: Never Smoker  . Smokeless tobacco: Never Used  Vaping Use  . Vaping Use: Never used  Substance Use Topics  . Alcohol use: No  . Drug use: No    Allergies: No Known Allergies  Medications Prior to Admission  Medication Sig Dispense Refill Last Dose  . terconazole (TERAZOL 7) 0.4 % vaginal cream Place 1 applicator vaginally at bedtime. 45 g 0 05/22/2021 at Unknown time  . cyclobenzaprine (FLEXERIL) 10 MG tablet Take 1 tablet (10 mg total) by  mouth every 8 (eight) hours as needed for muscle spasms. 30 tablet 2   . pantoprazole (PROTONIX) 20 MG tablet Take 1 tablet (20 mg total) by mouth 2 (two) times daily. 30 tablet 0   . Prenatal Vit-Fe Fumarate-FA (PRENATAL MULTIVITAMIN) TABS tablet Take 1 tablet by mouth daily at 12 noon.       Review of Systems  Constitutional: Negative.  Negative for fatigue and fever.  HENT: Negative.   Respiratory: Positive for chest tightness. Negative for shortness of breath.   Cardiovascular: Positive for chest pain.  Gastrointestinal: Negative.  Negative for abdominal pain, constipation, diarrhea, nausea and vomiting.  Genitourinary: Negative.  Negative for dysuria, vaginal bleeding and vaginal discharge.  Neurological: Positive for numbness. Negative for dizziness and headaches.   Physical Exam   Blood pressure 116/68, pulse 93, resp. rate 17, height 5\' 7"  (1.702 m), weight 109.3 kg, last menstrual period 09/06/2020, SpO2 100 %.  Physical Exam Vitals and nursing note reviewed.  Constitutional:      General: She is not in acute distress.    Appearance: She is  well-developed.  HENT:     Head: Normocephalic.  Eyes:     Pupils: Pupils are equal, round, and reactive to light.  Cardiovascular:     Rate and Rhythm: Normal rate and regular rhythm.     Heart sounds: Normal heart sounds.  Pulmonary:     Effort: Pulmonary effort is normal. No respiratory distress.     Breath sounds: Normal breath sounds.  Abdominal:     General: Bowel sounds are normal. There is no distension.     Palpations: Abdomen is soft.     Tenderness: There is no abdominal tenderness.  Skin:    General: Skin is warm and dry.  Neurological:     Mental Status: She is alert and oriented to person, place, and time.  Psychiatric:        Behavior: Behavior normal.        Thought Content: Thought content normal.        Judgment: Judgment normal.    Fetal Tracing:  Baseline: 130 Variability: moderate Accels:  15x15 Decels: none  Toco: none  Dilation: 1 Exam by:: Sharolyn Douglas, CNM   MAU Course  Procedures Results for orders placed or performed during the hospital encounter of 05/22/21 (from the past 24 hour(s))  Urinalysis, Routine w reflex microscopic Urine, Clean Catch     Status: Abnormal   Collection Time: 05/22/21  7:45 PM  Result Value Ref Range   Color, Urine YELLOW YELLOW   APPearance CLOUDY (A) CLEAR   Specific Gravity, Urine 1.006 1.005 - 1.030   pH 7.0 5.0 - 8.0   Glucose, UA NEGATIVE NEGATIVE mg/dL   Hgb urine dipstick SMALL (A) NEGATIVE   Bilirubin Urine NEGATIVE NEGATIVE   Ketones, ur NEGATIVE NEGATIVE mg/dL   Protein, ur NEGATIVE NEGATIVE mg/dL   Nitrite NEGATIVE NEGATIVE   Leukocytes,Ua LARGE (A) NEGATIVE   RBC / HPF 11-20 0 - 5 RBC/hpf   WBC, UA 21-50 0 - 5 WBC/hpf   Bacteria, UA MANY (A) NONE SEEN   Squamous Epithelial / LPF >50 (H) 0 - 5   MDM ED EKG- normal sinus rhythm UA,UC Tylenol PO  Patient denies any chest tightness, arm numbness or pain while in MAU. Encouraged outpatient follow up with cardiology if symptoms persist.  Assessment and Plan   1. Chest tightness   2. Pelvic pain affecting pregnancy in third trimester, antepartum   3. [redacted] weeks gestation of pregnancy    -Discharge home in stable condition -Rx for flexeril refilled -Labor precautions discussed -Patient advised to follow-up with OB as scheduled for prenatal care -Patient may return to MAU as needed or if her condition were to change or worsen  Wende Mott CNM 05/22/2021, 8:04 PM

## 2021-05-25 LAB — CULTURE, OB URINE

## 2021-06-05 ENCOUNTER — Inpatient Hospital Stay (HOSPITAL_COMMUNITY)
Admission: AD | Admit: 2021-06-05 | Discharge: 2021-06-05 | Disposition: A | Discharge status: Home / Self Care | Payer: Medicaid Other | Attending: Obstetrics & Gynecology | Admitting: Obstetrics & Gynecology

## 2021-06-05 ENCOUNTER — Other Ambulatory Visit: Payer: Self-pay

## 2021-06-05 ENCOUNTER — Encounter (HOSPITAL_COMMUNITY): Payer: Self-pay | Admitting: Obstetrics & Gynecology

## 2021-06-05 DIAGNOSIS — O99891 Other specified diseases and conditions complicating pregnancy: Secondary | ICD-10-CM

## 2021-06-05 DIAGNOSIS — O23593 Infection of other part of genital tract in pregnancy, third trimester: Secondary | ICD-10-CM | POA: Insufficient documentation

## 2021-06-05 DIAGNOSIS — N760 Acute vaginitis: Secondary | ICD-10-CM

## 2021-06-05 DIAGNOSIS — B9689 Other specified bacterial agents as the cause of diseases classified elsewhere: Secondary | ICD-10-CM | POA: Diagnosis not present

## 2021-06-05 DIAGNOSIS — Z3A38 38 weeks gestation of pregnancy: Secondary | ICD-10-CM

## 2021-06-05 DIAGNOSIS — B373 Candidiasis of vulva and vagina: Secondary | ICD-10-CM | POA: Insufficient documentation

## 2021-06-05 DIAGNOSIS — O212 Late vomiting of pregnancy: Secondary | ICD-10-CM | POA: Diagnosis not present

## 2021-06-05 DIAGNOSIS — B3731 Acute candidiasis of vulva and vagina: Secondary | ICD-10-CM

## 2021-06-05 DIAGNOSIS — Z0371 Encounter for suspected problem with amniotic cavity and membrane ruled out: Secondary | ICD-10-CM

## 2021-06-05 DIAGNOSIS — K117 Disturbances of salivary secretion: Secondary | ICD-10-CM

## 2021-06-05 DIAGNOSIS — O26893 Other specified pregnancy related conditions, third trimester: Secondary | ICD-10-CM | POA: Diagnosis present

## 2021-06-05 LAB — WET PREP, GENITAL
Sperm: NONE SEEN
Trich, Wet Prep: NONE SEEN

## 2021-06-05 LAB — URINALYSIS, ROUTINE W REFLEX MICROSCOPIC
Bilirubin Urine: NEGATIVE
Glucose, UA: NEGATIVE mg/dL
Hgb urine dipstick: NEGATIVE
Ketones, ur: NEGATIVE mg/dL
Nitrite: NEGATIVE
Protein, ur: NEGATIVE mg/dL
Specific Gravity, Urine: 1.008 (ref 1.005–1.030)
WBC, UA: >50 WBC/hpf — ABNORMAL HIGH (ref 0–5)
pH: 7 (ref 5.0–8.0)

## 2021-06-05 LAB — POCT FERN TEST: POCT Fern Test: NEGATIVE

## 2021-06-05 MED ORDER — TERCONAZOLE 0.4 % VA CREA: 1.0000 | TOPICAL_CREAM | Freq: Every day | VAGINAL | 0 refills | Status: AC

## 2021-06-05 MED ORDER — METRONIDAZOLE 500 MG PO TABS: 500.0000 mg | ORAL_TABLET | Freq: Two times a day (BID) | ORAL | 0 refills | Status: DC

## 2021-06-05 MED ORDER — GLYCOPYRROLATE 1 MG PO TABS: 1.0000 mg | ORAL_TABLET | Freq: Three times a day (TID) | ORAL | 0 refills | Status: DC

## 2021-06-05 MED ORDER — METOCLOPRAMIDE HCL 10 MG PO TABS: 10.0000 mg | ORAL_TABLET | Freq: Four times a day (QID) | ORAL | 0 refills | Status: DC

## 2021-06-05 MED ORDER — GLYCOPYRROLATE 1 MG PO TABS
1.0000 mg | ORAL_TABLET | Freq: Once | ORAL | Status: AC
  Administered 2021-06-05: 1 mg via ORAL
  Filled 2021-06-05: qty 1

## 2021-06-05 NOTE — MAU Provider Note (Signed)
S: Ms. Ann Schultz is a 25 y.o. G2P0101 at [redacted]w[redacted]d  who presents to MAU today complaining of leaking of fluid since 05/30/2021; only with vomiting. She denies vaginal bleeding. She denies contractions. She endorses vomiting and on-going H/A. She reports normal fetal movement.    O: BP 109/81 (BP Location: Right Arm)   Pulse 97   Temp 97.9 F (36.6 C) (Oral)   Resp 14   Ht 5\' 7"  (1.702 m)   Wt 107.5 kg   LMP 09/06/2020   SpO2 98%   BMI 37.12 kg/m  GENERAL: Well-developed, well-nourished female in no acute distress.  HEAD: Normocephalic, atraumatic.  CHEST: Normal effort of breathing, regular heart rate ABDOMEN: Soft, nontender, gravid PELVIC: Normal external female genitalia. Vagina is pink and rugated. Cervix with normal contour, no lesions. Watery, clumpy, adherent, white vaginal discharge.  Negative pooling.   Cervical exam:  Dilation: Ext Os= 1.5 cm, Int Os= Closed Effacement (%): 40 Cervical Position: Middle Presentation: Undeterminable Exam by: Sunday Corn, CNM  Fetal Monitoring: Baseline: 130 Variability: moderate Accelerations: present Decelerations: absent Contractions: none  Results for orders placed or performed during the hospital encounter of 06/05/21 (from the past 24 hour(s))  Urinalysis, Routine w reflex microscopic Urine, Clean Catch     Status: Abnormal   Collection Time: 06/05/21  1:12 PM  Result Value Ref Range   Color, Urine YELLOW YELLOW   APPearance HAZY (A) CLEAR   Specific Gravity, Urine 1.008 1.005 - 1.030   pH 7.0 5.0 - 8.0   Glucose, UA NEGATIVE NEGATIVE mg/dL   Hgb urine dipstick NEGATIVE NEGATIVE   Bilirubin Urine NEGATIVE NEGATIVE   Ketones, ur NEGATIVE NEGATIVE mg/dL   Protein, ur NEGATIVE NEGATIVE mg/dL   Nitrite NEGATIVE NEGATIVE   Leukocytes,Ua LARGE (A) NEGATIVE   RBC / HPF 0-5 0 - 5 RBC/hpf   WBC, UA >50 (H) 0 - 5 WBC/hpf   Bacteria, UA FEW (A) NONE SEEN   Squamous Epithelial / LPF 6-10 0 - 5   WBC Clumps PRESENT   POCT fern test      Status: None   Collection Time: 06/05/21  2:12 PM  Result Value Ref Range   POCT Fern Test Negative = intact amniotic membranes   Wet prep, genital     Status: Abnormal   Collection Time: 06/05/21  2:20 PM   Specimen: Vaginal  Result Value Ref Range   Yeast Wet Prep HPF POC PRESENT (A) NONE SEEN   Trich, Wet Prep NONE SEEN NONE SEEN   Clue Cells Wet Prep HPF POC PRESENT (A) NONE SEEN   WBC, Wet Prep HPF POC MANY (A) NONE SEEN   Sperm NONE SEEN     A: SIUP at [redacted]w[redacted]d  Membranes intact Bacterial vaginosis - Plan: Discharge patient  Candida vaginitis - Plan: Discharge patient  Ptyalism - Plan: Discharge patient  [redacted] weeks gestation of pregnancy  No leakage of amniotic fluid into vagina  P: Rx for Flagyl 500 mg BID x 7 days Rx for Terazol cream 0.4% vaginally hs x 7 days. Advised to start day 3 or 4 into BV treatment Rx for Robinul 1 mg TID Rx for Reglan 10 mg every 6 hours 30 mins prior to taking Flagyl Reassurance given that water has not broken and labor is not happening right now Keep scheduled appt with P4W tomorrow Patient verbalized an understanding of the plan of care and agrees.   Laury Deep, CNM 06/05/2021, 1:48 PM

## 2021-06-05 NOTE — MAU Note (Signed)
Ann Schultz is a 25 y.o. at [redacted]w[redacted]d here in MAU reporting: LOF since last Thursday but only when she vomits. States she has had a couple of episodes of vomiting today. Ongoing headache. No bleeding. +FM.  Onset of complaint: ongoing  Pain score: 6/10  Vitals:   06/05/21 1233  BP: 124/78  Pulse: (!) 116  Resp: 16  Temp: 97.9 F (36.6 C)  SpO2: 100%     FHT:141  Lab orders placed from triage: UA

## 2021-06-07 ENCOUNTER — Encounter (HOSPITAL_COMMUNITY): Payer: Self-pay | Admitting: Obstetrics and Gynecology

## 2021-06-07 ENCOUNTER — Inpatient Hospital Stay (HOSPITAL_COMMUNITY)
Admission: AD | Admit: 2021-06-07 | Discharge: 2021-06-07 | Disposition: A | Discharge status: Home / Self Care | Payer: Medicaid Other | Attending: Obstetrics and Gynecology | Admitting: Obstetrics and Gynecology

## 2021-06-07 DIAGNOSIS — O212 Late vomiting of pregnancy: Secondary | ICD-10-CM | POA: Insufficient documentation

## 2021-06-07 DIAGNOSIS — O26893 Other specified pregnancy related conditions, third trimester: Secondary | ICD-10-CM | POA: Insufficient documentation

## 2021-06-07 DIAGNOSIS — R8271 Bacteriuria: Secondary | ICD-10-CM | POA: Diagnosis not present

## 2021-06-07 DIAGNOSIS — O2343 Unspecified infection of urinary tract in pregnancy, third trimester: Secondary | ICD-10-CM

## 2021-06-07 DIAGNOSIS — R519 Headache, unspecified: Secondary | ICD-10-CM | POA: Insufficient documentation

## 2021-06-07 DIAGNOSIS — R1030 Lower abdominal pain, unspecified: Secondary | ICD-10-CM | POA: Diagnosis not present

## 2021-06-07 DIAGNOSIS — O99891 Other specified diseases and conditions complicating pregnancy: Secondary | ICD-10-CM

## 2021-06-07 DIAGNOSIS — Z79899 Other long term (current) drug therapy: Secondary | ICD-10-CM | POA: Diagnosis not present

## 2021-06-07 DIAGNOSIS — R12 Heartburn: Secondary | ICD-10-CM | POA: Diagnosis not present

## 2021-06-07 DIAGNOSIS — Z3A39 39 weeks gestation of pregnancy: Secondary | ICD-10-CM | POA: Insufficient documentation

## 2021-06-07 DIAGNOSIS — N390 Urinary tract infection, site not specified: Secondary | ICD-10-CM | POA: Diagnosis not present

## 2021-06-07 DIAGNOSIS — O219 Vomiting of pregnancy, unspecified: Secondary | ICD-10-CM

## 2021-06-07 DIAGNOSIS — Z3689 Encounter for other specified antenatal screening: Secondary | ICD-10-CM | POA: Insufficient documentation

## 2021-06-07 LAB — URINALYSIS, ROUTINE W REFLEX MICROSCOPIC
Bilirubin Urine: NEGATIVE
Glucose, UA: NEGATIVE mg/dL
Ketones, ur: NEGATIVE mg/dL
Nitrite: POSITIVE — AB
Protein, ur: NEGATIVE mg/dL
Specific Gravity, Urine: 1.005 (ref 1.005–1.030)
pH: 7 (ref 5.0–8.0)

## 2021-06-07 MED ORDER — CEFADROXIL 500 MG PO CAPS: 500.0000 mg | ORAL_CAPSULE | Freq: Two times a day (BID) | ORAL | 0 refills | Status: DC

## 2021-06-07 MED ORDER — ALUM & MAG HYDROXIDE-SIMETH 200-200-20 MG/5ML PO SUSP
30.0000 mL | Freq: Once | ORAL | Status: DC
  Filled 2021-06-07: qty 30

## 2021-06-07 MED ORDER — ONDANSETRON 4 MG PO TBDP
8.0000 mg | ORAL_TABLET | Freq: Once | ORAL | Status: AC
  Administered 2021-06-07: 8 mg via ORAL
  Filled 2021-06-07: qty 2

## 2021-06-07 NOTE — Progress Notes (Signed)
WRitten and verbal d/c instructions given and understanding voiced. Eating chicken noodle soup at d/c and tol well.

## 2021-06-07 NOTE — MAU Provider Note (Signed)
History     CSN: [id number]  Arrival date and time: 06/07/21 2043   None     Chief Complaint  Patient presents with   Emesis During Pregnancy   Ann Schultz is a 25 y.o. G2P0101 at [redacted]w[redacted]d who receives care at [facility].  She presents today for Emesis During Pregnancy.  She states she has been unable to keep anything down all day.  She reports she has tried to take medication for N/V and pytalism with no effects.  She states she has been trying to eat ice chips and drink water, but has been unsuccessful.  She states she has thrown up about 5x today.  She endorses fetal movement and some lower abdominal cramping that is intermittent.  She denies vaginal bleeding or discharge. Of note, upon provider arrival to bedside patient requests something to eat.  Patient reports she does not feel nauseous, but just has been vomiting.     OB History     Gravida  2   Para  1   Term      Preterm  1   AB      Living  1      SAB      IAB      Ectopic      Multiple      Live Births  1           Past Medical History:  Diagnosis Date   Allergy    Breast fibroadenoma    right    Past Surgical History:  Procedure Laterality Date   CYST EXCISION Right December 2012   EXCISION OF BREAST BIOPSY Left 05/31/2015   Procedure: LEFT EXCISIONAL BREAST BIOPSY;  Surgeon: Stark Klein, MD;  Location: [facility];  Service: General;  Laterality: Left;    Family History  Problem Relation Age of Onset   Cancer Maternal Grandmother        breast    Social History   Tobacco Use   Smoking status: Never   Smokeless tobacco: Never  Vaping Use   Vaping Use: Never used  Substance Use Topics   Alcohol use: No   Drug use: No    Allergies: No Known Allergies  Medications Prior to Admission  Medication Sig Dispense Refill Last Dose   famotidine (PEPCID) 20 MG tablet Take 20 mg by mouth daily.   06/07/2021   glycopyrrolate (ROBINUL) 1 MG tablet Take 1 tablet (1 mg total) by mouth 3 (three)  times daily. 90 tablet 0 06/07/2021   metoCLOPramide (REGLAN) 10 MG tablet Take 1 tablet (10 mg total) by mouth every 6 (six) hours for 14 days. 56 tablet 0 06/07/2021   metroNIDAZOLE (FLAGYL) 500 MG tablet Take 1 tablet (500 mg total) by mouth 2 (two) times daily. 14 tablet 0 06/07/2021   Prenatal Vit-Fe Fumarate-FA (PRENATAL MULTIVITAMIN) TABS tablet Take 1 tablet by mouth daily at 12 noon.   06/07/2021   cyclobenzaprine (FLEXERIL) 10 MG tablet Take 1 tablet (10 mg total) by mouth every 8 (eight) hours as needed for muscle spasms. 30 tablet 0    pantoprazole (PROTONIX) 20 MG tablet Take 1 tablet (20 mg total) by mouth 2 (two) times daily. 30 tablet 0    terconazole (TERAZOL 7) 0.4 % vaginal cream Place 1 applicator vaginally at bedtime for 7 days. 45 g 0     Review of Systems  Constitutional:  Negative for chills and fever.  Respiratory:  Negative for cough and shortness of breath.  Gastrointestinal:  Positive for abdominal pain (Cramping), nausea and vomiting. Negative for constipation and diarrhea.  Genitourinary:  Negative for difficulty urinating, dysuria, vaginal bleeding and vaginal discharge.  Neurological:  Positive for headaches. Negative for dizziness and light-headedness.  Physical Exam   Blood pressure 124/72, pulse 80, temperature 97.6 F (36.4 C), resp. rate 20, height 5\' 7"  (1.702 m), weight 109.3 kg, last menstrual period 09/06/2020, SpO2 100 %.  Physical Exam Constitutional:      Appearance: Normal appearance.  HENT:     Head: Normocephalic and atraumatic.  Eyes:     Conjunctiva/sclera: Conjunctivae normal.  Pulmonary:     Effort: Pulmonary effort is normal. No respiratory distress.  Musculoskeletal:        General: Normal range of motion.     Cervical back: Normal range of motion.  Neurological:     Mental Status: She is alert and oriented to person, place, and time.  Psychiatric:        Mood and Affect: Mood normal.        Behavior: Behavior normal.         Thought Content: Thought content normal.    Fetal Assessment 125 bpm, Mod Var, -Decels, +Accels Toco: Q6-8 min  MAU Course   Results for orders placed or performed during the hospital encounter of 06/07/21 (from the past 24 hour(s))  Urinalysis, Routine w reflex microscopic Urine, Clean Catch     Status: Abnormal   Collection Time: 06/07/21  9:26 PM  Result Value Ref Range   Color, Urine YELLOW YELLOW   APPearance HAZY (A) CLEAR   Specific Gravity, Urine 1.005 1.005 - 1.030   pH 7.0 5.0 - 8.0   Glucose, UA NEGATIVE NEGATIVE mg/dL   Hgb urine dipstick SMALL (A) NEGATIVE   Bilirubin Urine NEGATIVE NEGATIVE   Ketones, ur NEGATIVE NEGATIVE mg/dL   Protein, ur NEGATIVE NEGATIVE mg/dL   Nitrite POSITIVE (A) NEGATIVE   Leukocytes,Ua LARGE (A) NEGATIVE   RBC / HPF 6-10 0 - 5 RBC/hpf   WBC, UA 21-50 0 - 5 WBC/hpf   Bacteria, UA FEW (A) NONE SEEN   Squamous Epithelial / LPF 6-10 0 - 5   WBC Clumps PRESENT    Mucus PRESENT    Amorphous Crystal PRESENT    No results found.  MDM PE Start IV LR Bolus f/b Banana Bag Antiemetic PPI Assessment and Plan  25 year old G73P0101  SIUP at 39.1 weeks Cat I FT Vomiting Headache  -Reviewed POC to include IV fluids and antiemetic -Patient states she does not feel nauseous and would like to try oral medication. -Will give Zofran ODT and then attempt to eat. -If successful and UA is of no concern, will continue with PO treatment for HA. -Patient agrees with plan.  -Will monitor and reassess.   Maryann Conners MSN, CNM 06/07/2021, 10:04 PM   Reassessment (10:37 PM) -UA returns suspicious for UTI -Wills end for culture. -Nurse reports patient with c/o heartburn -Will give GI cocktail  Reassessment (11:21 PM) -Patient eating and reports no feelings of nausea. -Declines GI cocktail and tylenol. -Informed that she has what appears to be a UTI. -Will send script for Cefadroxil BID x 7 days.  -Reviewed current medications and usage.   Instructed to use Reglan and Robinul as needed.  -Patient to follow up as scheduled. -Encouraged to call or return to MAU if symptoms worsen or with the onset of new symptoms. -Discharged to home in improved condition.  Maryann Conners  MSN, CNM Energy manager, [facility] [employer]

## 2021-06-07 NOTE — MAU Note (Signed)
Pt stated nausea is better and she does not want Mylanta. Does not have heartburn currently

## 2021-06-07 NOTE — MAU Note (Addendum)
Was seen here Weds for n/v. Was given nausea meds but unable to keep down anything today. Had ob appt yesterday and given med for mucous but unable to keep down that med. N/V continues today. Having some lower abd pressure. 2cm 80% yesterday. Reports loose stools since Weds but last BM was yesterday. No one at home has been sick with similar sx

## 2021-06-07 NOTE — MAU Note (Signed)
OK to leave pt off EFM per Gavin Pound CNM. Pt had removed FM when up to BR.

## 2021-06-09 LAB — CULTURE, OB URINE

## 2021-06-12 ENCOUNTER — Other Ambulatory Visit (HOSPITAL_COMMUNITY): Payer: Self-pay

## 2021-06-12 MED ORDER — PANTOPRAZOLE SODIUM 40 MG PO TBEC
40.0000 mg | DELAYED_RELEASE_TABLET | Freq: Every day | ORAL | 0 refills | Status: DC
  Filled 2021-06-12: qty 30, 30d supply, fill #0

## 2021-06-13 ENCOUNTER — Encounter (HOSPITAL_COMMUNITY): Payer: Self-pay | Admitting: Obstetrics and Gynecology

## 2021-06-13 ENCOUNTER — Inpatient Hospital Stay (HOSPITAL_COMMUNITY): Payer: Medicaid Other

## 2021-06-13 ENCOUNTER — Other Ambulatory Visit: Payer: Self-pay

## 2021-06-13 ENCOUNTER — Inpatient Hospital Stay (HOSPITAL_COMMUNITY)
Admission: AD | Admit: 2021-06-13 | Discharge: 2021-06-14 | DRG: 807 | Disposition: A | Discharge status: Home / Self Care | Payer: Medicaid Other | Attending: Obstetrics and Gynecology | Admitting: Obstetrics and Gynecology

## 2021-06-13 DIAGNOSIS — E041 Nontoxic single thyroid nodule: Secondary | ICD-10-CM | POA: Diagnosis present

## 2021-06-13 DIAGNOSIS — Z3A40 40 weeks gestation of pregnancy: Secondary | ICD-10-CM

## 2021-06-13 DIAGNOSIS — O99284 Endocrine, nutritional and metabolic diseases complicating childbirth: Secondary | ICD-10-CM | POA: Diagnosis present

## 2021-06-13 DIAGNOSIS — Z3490 Encounter for supervision of normal pregnancy, unspecified, unspecified trimester: Secondary | ICD-10-CM | POA: Diagnosis present

## 2021-06-13 DIAGNOSIS — O26893 Other specified pregnancy related conditions, third trimester: Secondary | ICD-10-CM | POA: Diagnosis present

## 2021-06-13 DIAGNOSIS — Z20822 Contact with and (suspected) exposure to covid-19: Secondary | ICD-10-CM | POA: Diagnosis present

## 2021-06-13 LAB — CBC
HCT: 35.4 % — ABNORMAL LOW (ref 36.0–46.0)
Hemoglobin: 12.4 g/dL (ref 12.0–15.0)
MCH: 29 pg (ref 26.0–34.0)
MCHC: 35 g/dL (ref 30.0–36.0)
MCV: 82.7 fL (ref 80.0–100.0)
Platelets: 187 10*3/uL (ref 150–400)
RBC: 4.28 MIL/uL (ref 3.87–5.11)
RDW: 15.8 % — ABNORMAL HIGH (ref 11.5–15.5)
WBC: 9.5 10*3/uL (ref 4.0–10.5)
nRBC: 0 % (ref 0.0–0.2)

## 2021-06-13 LAB — TYPE AND SCREEN
ABO/RH(D): O POS
Antibody Screen: NEGATIVE

## 2021-06-13 LAB — RPR: RPR Ser Ql: NONREACTIVE

## 2021-06-13 LAB — RESP PANEL BY RT-PCR (FLU A&B, COVID) ARPGX2
Influenza A by PCR: NEGATIVE
Influenza B by PCR: NEGATIVE
SARS Coronavirus 2 by RT PCR: NEGATIVE

## 2021-06-13 MED ORDER — IBUPROFEN 600 MG PO TABS
600.0000 mg | ORAL_TABLET | Freq: Four times a day (QID) | ORAL | Status: DC
  Administered 2021-06-13 – 2021-06-14 (×5): 600 mg via ORAL
  Filled 2021-06-13 (×5): qty 1

## 2021-06-13 MED ORDER — OXYCODONE HCL 5 MG PO TABS
10.0000 mg | ORAL_TABLET | ORAL | Status: DC | PRN
Start: 2021-06-13 — End: 2021-06-14

## 2021-06-13 MED ORDER — OXYCODONE-ACETAMINOPHEN 5-325 MG PO TABS
2.0000 | ORAL_TABLET | ORAL | Status: DC | PRN
Start: 2021-06-13 — End: 2021-06-13

## 2021-06-13 MED ORDER — PRENATAL MULTIVITAMIN CH
1.0000 | ORAL_TABLET | Freq: Every day | ORAL | Status: DC
  Administered 2021-06-13 – 2021-06-14 (×2): 1 via ORAL
  Filled 2021-06-13 (×2): qty 1

## 2021-06-13 MED ORDER — COCONUT OIL OIL: 1.0000 "application " | TOPICAL_OIL | Status: DC | PRN

## 2021-06-13 MED ORDER — LACTATED RINGERS IV SOLN: 500.0000 mL | INTRAVENOUS | Status: DC | PRN

## 2021-06-13 MED ORDER — WITCH HAZEL-GLYCERIN EX PADS: 1.0000 "application " | MEDICATED_PAD | CUTANEOUS | Status: DC | PRN

## 2021-06-13 MED ORDER — OXYTOCIN-SODIUM CHLORIDE 30-0.9 UT/500ML-% IV SOLN
2.5000 [IU]/h | INTRAVENOUS | Status: DC
  Administered 2021-06-13: 2.5 [IU]/h via INTRAVENOUS
  Filled 2021-06-13: qty 500

## 2021-06-13 MED ORDER — OXYTOCIN BOLUS FROM INFUSION
333.0000 mL | Freq: Once | INTRAVENOUS | Status: AC
  Administered 2021-06-13: 333 mL via INTRAVENOUS

## 2021-06-13 MED ORDER — LACTATED RINGERS IV SOLN: INTRAVENOUS | Status: DC

## 2021-06-13 MED ORDER — OXYCODONE-ACETAMINOPHEN 5-325 MG PO TABS: 1.0000 | ORAL_TABLET | ORAL | Status: DC | PRN

## 2021-06-13 MED ORDER — TETANUS-DIPHTH-ACELL PERTUSSIS 5-2.5-18.5 LF-MCG/0.5 IM SUSY: 0.5000 mL | PREFILLED_SYRINGE | Freq: Once | INTRAMUSCULAR | Status: DC

## 2021-06-13 MED ORDER — ONDANSETRON HCL 4 MG/2ML IJ SOLN: 4.0000 mg | Freq: Four times a day (QID) | INTRAMUSCULAR | Status: DC | PRN

## 2021-06-13 MED ORDER — TERBUTALINE SULFATE 1 MG/ML IJ SOLN: 0.2500 mg | Freq: Once | INTRAMUSCULAR | Status: DC | PRN

## 2021-06-13 MED ORDER — ACETAMINOPHEN 325 MG PO TABS
650.0000 mg | ORAL_TABLET | ORAL | Status: DC | PRN
  Administered 2021-06-13: 650 mg via ORAL
  Filled 2021-06-13: qty 2

## 2021-06-13 MED ORDER — ACETAMINOPHEN 325 MG PO TABS: 650.0000 mg | ORAL_TABLET | ORAL | Status: DC | PRN

## 2021-06-13 MED ORDER — SOD CITRATE-CITRIC ACID 500-334 MG/5ML PO SOLN: 30.0000 mL | ORAL | Status: DC | PRN

## 2021-06-13 MED ORDER — DIPHENHYDRAMINE HCL 25 MG PO CAPS: 25.0000 mg | ORAL_CAPSULE | Freq: Four times a day (QID) | ORAL | Status: DC | PRN

## 2021-06-13 MED ORDER — ONDANSETRON HCL 4 MG/2ML IJ SOLN: 4.0000 mg | INTRAMUSCULAR | Status: DC | PRN

## 2021-06-13 MED ORDER — ZOLPIDEM TARTRATE 5 MG PO TABS: 5.0000 mg | ORAL_TABLET | Freq: Every evening | ORAL | Status: DC | PRN

## 2021-06-13 MED ORDER — ONDANSETRON HCL 4 MG PO TABS: 4.0000 mg | ORAL_TABLET | ORAL | Status: DC | PRN

## 2021-06-13 MED ORDER — SIMETHICONE 80 MG PO CHEW: 80.0000 mg | CHEWABLE_TABLET | ORAL | Status: DC | PRN

## 2021-06-13 MED ORDER — LIDOCAINE HCL (PF) 1 % IJ SOLN
30.0000 mL | INTRAMUSCULAR | Status: AC | PRN
  Administered 2021-06-13: 30 mL via SUBCUTANEOUS
  Filled 2021-06-13: qty 30

## 2021-06-13 MED ORDER — MISOPROSTOL 50MCG HALF TABLET
50.0000 ug | ORAL_TABLET | ORAL | Status: DC
  Administered 2021-06-13: 50 ug via ORAL
  Filled 2021-06-13: qty 1

## 2021-06-13 MED ORDER — OXYCODONE HCL 5 MG PO TABS: 5.0000 mg | ORAL_TABLET | ORAL | Status: DC | PRN

## 2021-06-13 MED ORDER — BENZOCAINE-MENTHOL 20-0.5 % EX AERO: 1.0000 "application " | INHALATION_SPRAY | CUTANEOUS | Status: DC | PRN

## 2021-06-13 MED ORDER — BUTORPHANOL TARTRATE 1 MG/ML IJ SOLN
1.0000 mg | INTRAMUSCULAR | Status: DC | PRN
  Administered 2021-06-13 (×2): 1 mg via INTRAVENOUS
  Filled 2021-06-13 (×2): qty 1

## 2021-06-13 MED ORDER — DIBUCAINE (PERIANAL) 1 % EX OINT: 1.0000 "application " | TOPICAL_OINTMENT | CUTANEOUS | Status: DC | PRN

## 2021-06-13 MED ORDER — SENNOSIDES-DOCUSATE SODIUM 8.6-50 MG PO TABS
2.0000 | ORAL_TABLET | ORAL | Status: DC
  Administered 2021-06-13 – 2021-06-14 (×2): 2 via ORAL
  Filled 2021-06-13 (×2): qty 2

## 2021-06-13 NOTE — H&P (Signed)
Ann Schultz is a 25 y.o. female presenting for IOL. 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 OB History     Gravida  2   Para  1   Term      Preterm  1   AB      Living  1      SAB      IAB      Ectopic      Multiple      Live Births  1          Past Medical History:  Diagnosis Date   Allergy    Breast fibroadenoma    right   Past Surgical History:  Procedure Laterality Date   CYST EXCISION Right December 2012   EXCISION OF BREAST BIOPSY Left 05/31/2015   Procedure: LEFT EXCISIONAL BREAST BIOPSY;  Surgeon: Ann Klein, MD;  Location: [facility];  Service: General;  Laterality: Left;   Family History: family history includes Cancer in her maternal grandmother. Social History:  reports that she has never smoked. She has never used smokeless tobacco. She reports that she does not drink alcohol and does not use drugs.     Maternal Diabetes: No Genetic Screening: Normal Maternal Ultrasounds/Referrals: Normal Fetal Ultrasounds or other Referrals:  None Maternal Substance Abuse:  No Significant Maternal Medications:  None Significant Maternal Lab Results:  Group B Strep negative Other Comments:  None  Review of Systems History Dilation: 10 Effacement (%): 100 Station: 0 Exam by:: Dr. Gaetano Net Blood pressure 120/74, pulse 95, temperature 98.3 F (36.8 C), temperature source Oral, resp. rate 18, height 5\' 7"  (1.702 m), weight 107.5 kg, last menstrual period 09/06/2020. Exam Physical Exam  Prenatal labs: ABO, Rh: --/--/O POS (06/16 0025) Antibody: NEG (06/16 0025) Rubella: Immune (11/04 0000) RPR:    HBsAg: Negative (11/04 0000)  HIV: Non-reactive (11/04 0000)  GBS: Negative/-- (05/19 0000)   Assessment/Plan: 25 yo G2P1 @ 40 0/7 wks for IOL   Ann Schultz 06/13/2021, 5:26 AM

## 2021-06-13 NOTE — Lactation Note (Signed)
This note was copied from a baby's chart. Lactation Consultation Note  Patient Name: Ann Schultz [id number] Date: 06/13/2021 Reason for consult: L&D Initial assessment;1st time breastfeeding;Primapara;Term Age:27 hours   Initial LC Consult:  Visited with family < 1 hour after birth 12 awake STS on mother's chest when I arrived.  Assisted to latch to the left breast.  Observed baby feeding on/off for 5 minutes and still latched when I left the room.  Reassured parents that lactation services will be provided on the M/B unit.  Allowed time for family bonding.  Father and family member present.   Maternal Data Has patient been taught Hand Expression?: No Does the patient have breastfeeding experience prior to this delivery?: No  Feeding Mother's Current Feeding Choice: Breast Milk  LATCH Score Latch: Repeated attempts needed to sustain latch, nipple held in mouth throughout feeding, stimulation needed to elicit sucking reflex.  Audible Swallowing: None  Type of Nipple: Everted at rest and after stimulation  Comfort (Breast/Nipple): Soft / non-tender  Hold (Positioning): Assistance needed to correctly position infant at breast and maintain latch.  LATCH Score: 6   Lactation Tools Discussed/Used    Interventions Interventions: Assisted with latch;Skin to skin  Discharge    Consult Status Consult Status: Follow-up Date: 06/13/21 Follow-up type: In-patient    Geovanny Sartin R Kristy Catoe 06/13/2021, 5:58 AM

## 2021-06-13 NOTE — Lactation Note (Addendum)
This note was copied from a baby's chart. Lactation Consultation Note  Patient Name: Ann Schultz DHWYS'H Date: 06/13/2021 Reason for consult: Follow-up assessment;Mother's request;Difficult latch;Term Age:27 hours P2, term female infant, infant had 3 voids since birth. Per mom, her 1st child was in NICU and he never latched at the breast, she pumped until he was one year.  Per mom, she only latched infant on her left breast and she has BF infant 4 times since birth. Per mom, infant doesn't latch to her right breast, LC observed mom has flat nipples. LC assisted mom with breast stimulation prior to latching infant on her right breast using the football hold position, infant sustained latch and BF with depth, infant was still breastfeeding after 23 minutes when Wayne left the room. Mom knows to breastfeed infant on both breast during a feeding. Mom's plan: 1- Mom will pre-pump breast with hand pump prior to latching infant at the breast to help evert nipple shaft out more. 2- Mom will continue to breastfeed infant according to hunger cues, 8 to 12+ times or more within 24 hours, STS. 3- Mom knows to ask RN or LC for assistance with latching infant at the breast if needed. 4- Mom will wear breast shells in bra during the day and not at night.  Maternal Data    Feeding Mother's Current Feeding Choice: Breast Milk  LATCH Score Latch: Grasps breast easily, tongue down, lips flanged, rhythmical sucking.  Audible Swallowing: Spontaneous and intermittent  Type of Nipple: Flat  Comfort (Breast/Nipple): Soft / non-tender  Hold (Positioning): Assistance needed to correctly position infant at breast and maintain latch.  LATCH Score: 8   Lactation Tools Discussed/Used Tools: Shells;Pump Breast pump type: Manual Pump Education: Setup, frequency, and cleaning;Milk Storage Reason for Pumping: Mom will pre-pump breast prior to latching infant at the breast due to having flat nipples. Pumping  frequency: pre-pump breast to help with latch at every feeding.  Interventions Interventions: Assisted with latch;Skin to skin;Pre-pump if needed;Breast compression;Adjust position;Support pillows;Position options;Hand pump;Education  Discharge Pump: Manual;Personal (Per mom, she has DEBP at home.) [facility] Program: Yes  Consult Status Consult Status: Follow-up Date: 06/14/21 Follow-up type: In-patient    Ann Schultz 06/13/2021, 5:37 PM

## 2021-06-13 NOTE — Progress Notes (Signed)
Delivery Note At 5:07 AM a viable female was delivered via Vaginal, Spontaneous (Presentation: Left Occiput Anterior).  APGAR: 8, 9; weight  .   Placenta status: Spontaneous, Intact.  Cord: 3 vessels with the following complications: None.  Cord pH: pending Rapid progress with Cytotec #1. C/C/0/vtx>AROM-clear, rapid second stage Anesthesia: None Episiotomy: None Lacerations: 2nd degree ML repaired Suture Repair: 2.0 vicryl rapide Est. Blood Loss (mL): 200  Mom to postpartum.  Baby to Couplet care / Skin to Skin.  Ann Schultz 06/13/2021, 5:31 AM

## 2021-06-13 NOTE — Lactation Note (Signed)
This note was copied from a baby's chart. Lactation Consultation Note  Patient Name: Boy Amilah Greenspan [id number] Date: 06/13/2021 Reason for consult: Mother's request Age:27 hours Mom requested latch assistance due to infant being sleepy past 3 hours. Noank burped baby and did suck training infant became alert and started cuing to breastfeed. Mom latched infant on her left breast and did breast stimulation, infant sustained latch and was still breastfeeding past 10 minutes when LC left the room. Lott reminded mom to continue to stimulate infant to BF such as talking to infant, gently stroking infant's shoulder and neck and doing breast compression. Mom will continue to breastfeed infant according to feeding cues, 8 to 12+ times within 24 hours, STS.  Maternal Data    Feeding Mother's Current Feeding Choice: Breast Milk  LATCH Score Latch: Grasps breast easily, tongue down, lips flanged, rhythmical sucking.  Audible Swallowing: Spontaneous and intermittent  Type of Nipple: Everted at rest and after stimulation  Comfort (Breast/Nipple): Soft / non-tender  Hold (Positioning): Assistance needed to correctly position infant at breast and maintain latch.  LATCH Score: 9   Lactation Tools Discussed/Used    Interventions Interventions: Skin to skin;Pre-pump if needed;Breast compression;Adjust position;Position options;Support pillows;Expressed milk;Education  Discharge    Consult Status Consult Status: Follow-up Date: 06/14/21 Follow-up type: In-patient    [name] 06/13/2021, 9:30 PM

## 2021-06-14 LAB — CBC
HCT: 29.1 % — ABNORMAL LOW (ref 36.0–46.0)
Hemoglobin: 10 g/dL — ABNORMAL LOW (ref 12.0–15.0)
MCH: 29.1 pg (ref 26.0–34.0)
MCHC: 34.4 g/dL (ref 30.0–36.0)
MCV: 84.6 fL (ref 80.0–100.0)
Platelets: 176 10*3/uL (ref 150–400)
RBC: 3.44 MIL/uL — ABNORMAL LOW (ref 3.87–5.11)
RDW: 15.9 % — ABNORMAL HIGH (ref 11.5–15.5)
WBC: 11.6 10*3/uL — ABNORMAL HIGH (ref 4.0–10.5)
nRBC: 0 % (ref 0.0–0.2)

## 2021-06-14 NOTE — Progress Notes (Signed)
Postpartum Progress Note  Post Partum Day 1 s/p spontaneous vaginal delivery.  Patient reports well-controlled pain, ambulating without difficulty, voiding spontaneously, tolerating PO.  Vaginal bleeding is appropriate.   Objective: Blood pressure 102/60, pulse 80, temperature 98.2 F (36.8 C), temperature source Oral, resp. rate 16, height 5\' 7"  (1.702 m), weight 107.5 kg, last menstrual period 09/06/2020, SpO2 100 %, unknown if currently breastfeeding.  Physical Exam:  General: alert and no distress Lochia: appropriate Uterine Fundus: firm DVT Evaluation: No evidence of DVT seen on physical exam.  Recent Labs    06/13/21 0023 06/14/21 0532  HGB 12.4 10.0*  HCT 35.4* 29.1*    Assessment/Plan: Postpartum Day 1, s/p vaginal delivery. Continue routine postpartum care Lactation following Desires circ, will perform prior to discharge with nursery clearance Anticipate discharge home today or tomorrow   LOS: 1 day   Carlyon Shadow 06/14/2021, 7:23 AM

## 2021-06-14 NOTE — Lactation Note (Signed)
This note was copied from a baby's chart. Lactation Consultation Note  Patient Name: Ann Schultz [id number] Date: 06/14/2021 Reason for consult: Follow-up assessment;Term;1st time breastfeeding Age:27 hours  P2 mother whose infant is now 71 hours old.  This is a term baby at 40+0 hours.  Mother did not breast feed her first child (20 years old).    Mother requested assistance.  Baby awake but not showing feeding cues.  Per flowsheet, baby fed approximately 2 hours ago.  Mother's nipples are flat.  She is using a manual pump to help with nipple eversion.  Mother did  some pre-pumping while I assessed baby's suck.  Nipple was able to evert after pre-pumping.  Latched easily but baby not interested in initiating a suck.  Breast compressions and gentle stimulation demonstrated.  He took a few sucks and pushed back.  Placed him STS on mother's chest and he was calm.  Reviewed breast feeding basics with parents.  Showed father how he can assist mother with gentle stimulation during feedings.    Mother will continue to feed 8-12 times/24 hours or sooner if baby shows cues.  Discussed cluster feeding.  Mother will call for latch assistance as needed.  Mother has a DEBP for home use.  RN updated.   Maternal Data Has patient been taught Hand Expression?: Yes  Feeding Mother's Current Feeding Choice: Breast Milk  LATCH Score Latch: Too sleepy or reluctant, no latch achieved, no sucking elicited.  Audible Swallowing: None  Type of Nipple: Flat  Comfort (Breast/Nipple): Soft / non-tender  Hold (Positioning): Assistance needed to correctly position infant at breast and maintain latch.  LATCH Score: 4   Lactation Tools Discussed/Used Tools: Shells;Pump;Coconut oil Breast pump type: Manual Pump Education: Setup, frequency, and cleaning (Reviewed) Reason for Pumping: Nipple eversion Pumping frequency: Prn  Interventions Interventions: Breast feeding basics reviewed;Assisted with  latch;Skin to skin;Breast massage;Hand express;Pre-pump if needed;Breast compression;Adjust position;Hand pump;Shells;Coconut oil;Position options;Support pillows;Education  Discharge Pump: Manual;Personal  Consult Status Consult Status: Follow-up Date: 06/15/21 Follow-up type: In-patient    Ann Schultz 06/14/2021, 10:33 AM

## 2021-06-15 NOTE — Discharge Summary (Signed)
Obstetric Discharge Summary  Ann Schultz is a 25 y.o. female that presented on 06/13/2021 for induction of labor.  She was admitted to labor and delivery for her induction.  Her labor course was uncomplicated but notable for rapid progression and she delivered a viable female infant on 06/13/21.  Her postpartum course was uncomplicated and on PPD#1, she reported well controlled pain, spontaneous voiding, ambulating without difficulty, and tolerating PO.  She was stable for discharge home on 06/14/21 with plans for in-office follow up.  Hemoglobin  Date Value Ref Range Status  06/14/2021 10.0 (L) 12.0 - 15.0 g/dL Final   HCT  Date Value Ref Range Status  06/14/2021 29.1 (L) 36.0 - 46.0 % Final    Physical Exam:  General: alert and no distress Lochia: appropriate Uterine Fundus: firm DVT Evaluation: No evidence of DVT seen on physical exam.  Discharge Diagnoses: Term Pregnancy-delivered  Discharge Information: Date: 06/15/2021 Activity: Pelvic rest, as tolerated Diet: routine Medications: Tylenol, motrin Condition: stable Instructions: Refer to practice specific booklet.  Discussed prior to discharge.  Discharge to: [facility] Follow up.   Why: Please follow up for 6 week postpartum visit. Contact information: [address] [place] [address] [phone]                 Newborn Data: Live born female  Birth Weight: 6 lb 13.9 oz (3116 g) APGAR: 8, 9  Newborn Delivery   Birth date/time: 06/13/2021 05:07:00 Delivery type: Vaginal, Spontaneous      Home with mother.  Carlyon Shadow 06/15/2021, 11:21 AM

## 2022-01-28 ENCOUNTER — Encounter: Payer: Self-pay | Admitting: Emergency Medicine

## 2022-01-28 ENCOUNTER — Ambulatory Visit
Admission: EM | Admit: 2022-01-28 | Discharge: 2022-01-28 | Disposition: A | Discharge status: Home / Self Care | Payer: Medicaid Other | Attending: Student | Admitting: Student

## 2022-01-28 ENCOUNTER — Other Ambulatory Visit: Payer: Self-pay

## 2022-01-28 DIAGNOSIS — S39012A Strain of muscle, fascia and tendon of lower back, initial encounter: Secondary | ICD-10-CM | POA: Diagnosis not present

## 2022-01-28 DIAGNOSIS — Z0289 Encounter for other administrative examinations: Secondary | ICD-10-CM

## 2022-01-28 DIAGNOSIS — B349 Viral infection, unspecified: Secondary | ICD-10-CM

## 2022-01-28 DIAGNOSIS — W19XXXA Unspecified fall, initial encounter: Secondary | ICD-10-CM

## 2022-01-28 MED ORDER — CYCLOBENZAPRINE HCL 10 MG PO TABS: 10.0000 mg | ORAL_TABLET | Freq: Two times a day (BID) | ORAL | 0 refills | Status: DC | PRN

## 2022-01-28 NOTE — Discharge Instructions (Addendum)
-  Flexeril twice daily for muscular pain. This can cause drowsiness, don't take before driving.  -You can take Tylenol up to 1000 mg 3 times daily, and ibuprofen up to 600 mg 3 times daily with food.  You can take these together, or alternate every 3-4 hours. -Heating pad, gentle stretching. -With a virus, you're typically contagious for 5-7 days, or as long as you're having fevers.

## 2022-01-28 NOTE — ED Triage Notes (Signed)
Pt c/o nasal congestion, bodyaches, and cough x 5 days. [place] and injured her back 5 days ago.

## 2022-01-28 NOTE — ED Provider Notes (Signed)
[facility]    CSN: [id number] Arrival date & time: 01/28/22  1026      History   Chief Complaint Chief Complaint  Patient presents with   Nasal Congestion   Cough   Generalized Body Aches    HPI Ann Schultz is a 26 y.o. female presenting with congestion, headache, back pain, neck pain following a fall. Medical history back pain during pregnancy. Lactating mother.  -Cough, congestion for 5 days, improving on its own.  Has attempted over-the-counter medications with some relief. -Back pain following a fall that occurred 5 days ago.  States that she was skating and fell backwards.  Next day woke up with lower back pain and neck pain, worse with movement.  Has attempted ibuprofen and heating pad without relief.  HPI  Past Medical History:  Diagnosis Date   Allergy    Breast fibroadenoma    right    Patient Active Problem List   Diagnosis Date Noted   Encounter for induction of labor 06/13/2021   Term pregnancy delivered 06/13/2021   Breast mass, right, probable fibroadenoma 11/10/2011    Past Surgical History:  Procedure Laterality Date   CYST EXCISION Right December 2012   EXCISION OF BREAST BIOPSY Left 05/31/2015   Procedure: LEFT EXCISIONAL BREAST BIOPSY;  Surgeon: Stark Klein, MD;  Location: [facility];  Service: General;  Laterality: Left;    OB History     Gravida  2   Para  2   Term  1   Preterm  1   AB      Living  2      SAB      IAB      Ectopic      Multiple  0   Live Births  2            Home Medications    Prior to Admission medications   Medication Sig Start Date End Date Taking? Authorizing Provider  cyclobenzaprine (FLEXERIL) 10 MG tablet Take 1 tablet (10 mg total) by mouth 2 (two) times daily as needed for muscle spasms. 01/28/22   Hazel Sams, PA-C  famotidine (PEPCID) 20 MG tablet Take 20 mg by mouth daily.    [provider]  Prenatal Vit-Fe Fumarate-FA (PRENATAL MULTIVITAMIN) TABS tablet Take 1  tablet by mouth daily at 12 noon.    [provider]    Family History Family History  Problem Relation Age of Onset   Cancer Maternal Grandmother        breast    Social History Social History   Tobacco Use   Smoking status: Never   Smokeless tobacco: Never  Vaping Use   Vaping Use: Never used  Substance Use Topics   Alcohol use: No   Drug use: No     Allergies   Patient has no known allergies.   Review of Systems Review of Systems  Constitutional:  Negative for appetite change, chills and fever.  HENT:  Positive for congestion. Negative for ear pain, rhinorrhea, sinus pressure, sinus pain and sore throat.   Eyes:  Negative for redness and visual disturbance.  Respiratory:  Positive for cough. Negative for chest tightness, shortness of breath and wheezing.   Cardiovascular:  Negative for chest pain and palpitations.  Gastrointestinal:  Negative for abdominal pain, constipation, diarrhea, nausea and vomiting.  Genitourinary:  Negative for dysuria, frequency and urgency.  Musculoskeletal:  Positive for back pain. Negative for myalgias.  Neurological:  Negative for dizziness,  weakness and headaches.  Psychiatric/Behavioral:  Negative for confusion.   All other systems reviewed and are negative.   Physical Exam Triage Vital Signs ED Triage Vitals  Enc Vitals Group     BP      Pulse      Resp      Temp      Temp src      SpO2      Weight      Height      Head Circumference      Peak Flow      Pain Score      Pain Loc      Pain Edu?      Excl. in [place]?    No data found.  Updated Vital Signs BP 111/75 (BP Location: Left Arm)    Pulse 79    Temp 98.6 F (37 C) (Oral)    Resp 16    LMP 01/08/2022    SpO2 97%   Visual Acuity Right Eye Distance:   Left Eye Distance:   Bilateral Distance:    Right Eye Near:   Left Eye Near:    Bilateral Near:     Physical Exam Vitals reviewed.  Constitutional:      General: She is not in acute distress.     Appearance: Normal appearance. She is not ill-appearing.  HENT:     Head: Normocephalic and atraumatic.     Right Ear: Tympanic membrane, ear canal and external ear normal. No tenderness. No middle ear effusion. There is no impacted cerumen. Tympanic membrane is not perforated, erythematous, retracted or bulging.     Left Ear: Tympanic membrane, ear canal and external ear normal. No tenderness.  No middle ear effusion. There is no impacted cerumen. Tympanic membrane is not perforated, erythematous, retracted or bulging.     Nose: Nose normal. No congestion.     Mouth/Throat:     Mouth: Mucous membranes are moist.     Pharynx: Uvula midline. No oropharyngeal exudate or posterior oropharyngeal erythema.  Eyes:     Extraocular Movements: Extraocular movements intact.     Pupils: Pupils are equal, round, and reactive to light.  Cardiovascular:     Rate and Rhythm: Normal rate and regular rhythm.     Heart sounds: Normal heart sounds.  Pulmonary:     Effort: Pulmonary effort is normal.     Breath sounds: Normal breath sounds. No decreased breath sounds, wheezing, rhonchi or rales.  Abdominal:     Palpations: Abdomen is soft.     Tenderness: There is no abdominal tenderness. There is no guarding or rebound.  Musculoskeletal:     Comments: Right-sided lumbar paraspinous muscle tenderness to palpation, pain elicited with movement.  Strength and sensation grossly intact upper and lower extremities.  No midline spinous tenderness, deformity, step-off.  No other bony tenderness, deformity, injury.  Negative snuffbox tenderness bilaterally.  Lymphadenopathy:     Cervical: No cervical adenopathy.     Right cervical: No superficial cervical adenopathy.    Left cervical: No superficial cervical adenopathy.  Neurological:     General: No focal deficit present.     Mental Status: She is alert and oriented to person, place, and time.  Psychiatric:        Mood and Affect: Mood normal.        Behavior:  Behavior normal.        Thought Content: Thought content normal.        Judgment: Judgment normal.  UC Treatments / Results  Labs (all labs ordered are listed, but only abnormal results are displayed) Labs Reviewed - No data to display  EKG   Radiology No results found.  Procedures Procedures (including critical care time)  Medications Ordered in UC Medications - No data to display  Initial Impression / Assessment and Plan / UC Course  I have reviewed the triage vital signs and the nursing notes.  Pertinent labs & imaging results that were available during my care of the patient were reviewed by me and considered in my medical decision making (see chart for details).     This patient is a very pleasant 26 y.o. year old female presenting with lumbar strain following fall; viral syndrome resolving on its own. Afebrile, nontachy. Lactating mother, States she is not pregnant or breastfeeding.  Flexeril sent. Continue heating pad, ROM exercises.   Viral syndrome is resolving on its own, continue OTC medications. Negative home covid test.   Work note provided x2 days at patient request. ED return precautions discussed. Patient verbalizes understanding and agreement.   .   Final Clinical Impressions(s) / UC Diagnoses   Final diagnoses:  Strain of lumbar region, initial encounter  Fall, initial encounter  Lactating mother  Viral syndrome  Encounter to obtain excuse from work     Discharge Instructions      -Flexeril twice daily for muscular pain. This can cause drowsiness, don't take before driving.  -You can take Tylenol up to 1000 mg 3 times daily, and ibuprofen up to 600 mg 3 times daily with food.  You can take these together, or alternate every 3-4 hours. -Heating pad, gentle stretching. -With a virus, you're typically contagious for 5-7 days, or as long as you're having fevers.       ED Prescriptions     Medication Sig Dispense Auth. Provider    cyclobenzaprine (FLEXERIL) 10 MG tablet  (Status: Discontinued) Take 1 tablet (10 mg total) by mouth 2 (two) times daily as needed for muscle spasms. 20 tablet Hazel Sams, PA-C   cyclobenzaprine (FLEXERIL) 10 MG tablet Take 1 tablet (10 mg total) by mouth 2 (two) times daily as needed for muscle spasms. 20 tablet Hazel Sams, PA-C      PDMP not reviewed this encounter.   Hazel Sams, PA-C 01/28/22 1118

## 2022-04-23 IMAGING — US US THYROID
1 series · 14 of 25 positions shown · non-contrast
Comparison: None.

CLINICAL DATA: Goiter.

EXAM:
THYROID ULTRASOUND
TECHNIQUE: Ultrasound examination of the thyroid gland and adjacent soft
tissues was performed.

[Series 1: us thyroid mc & wl · 53 acquisitions, 14 frames shown]
[im 1/53]
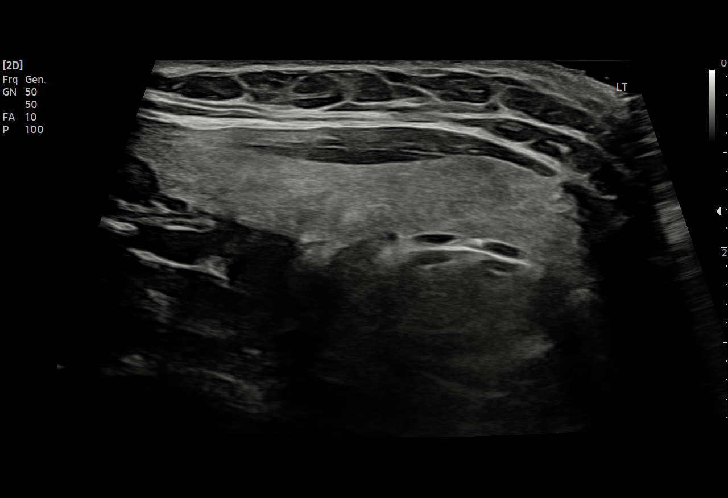
[im 5/53]
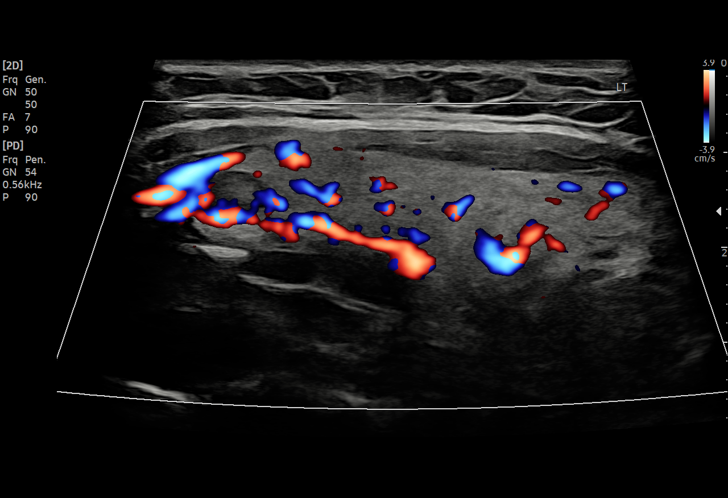
[im 9/53]
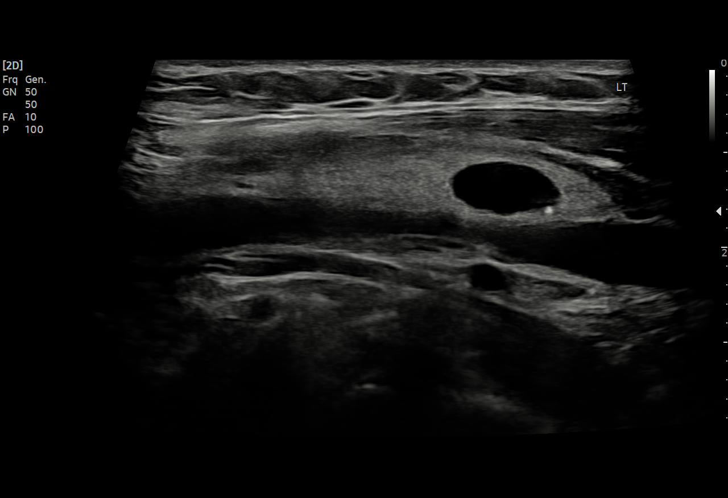
[im 14/53]
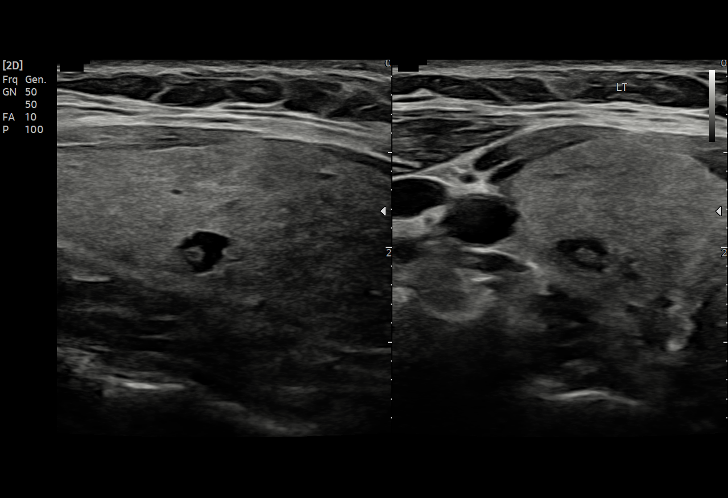
[im 18/53]
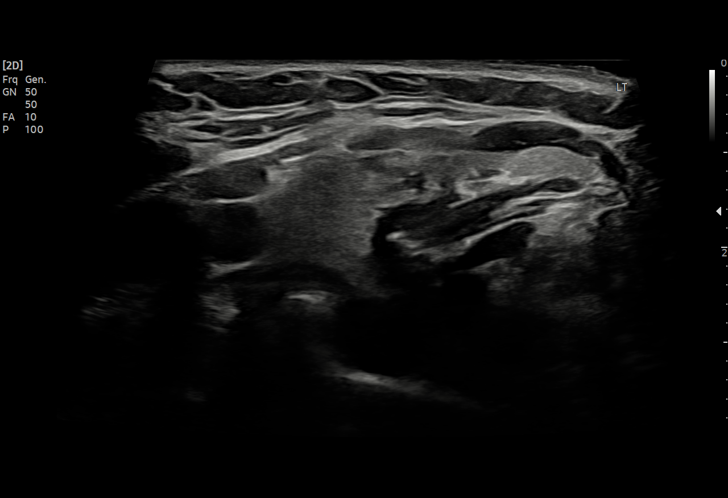
[im 20/53]
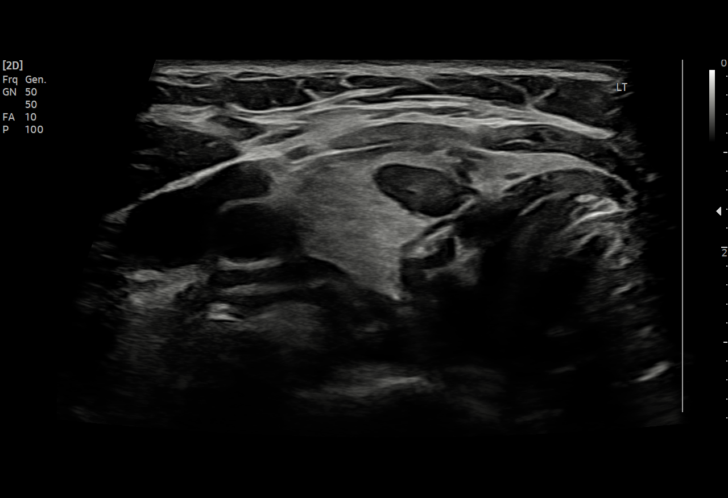
[im 24/53]
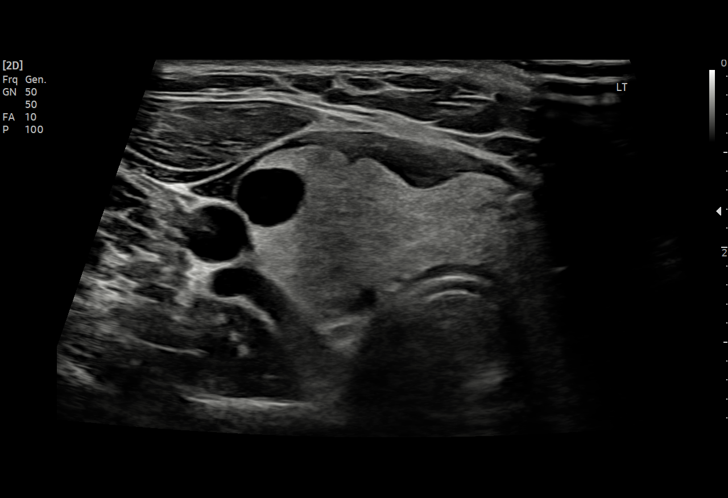
[im 29/53]
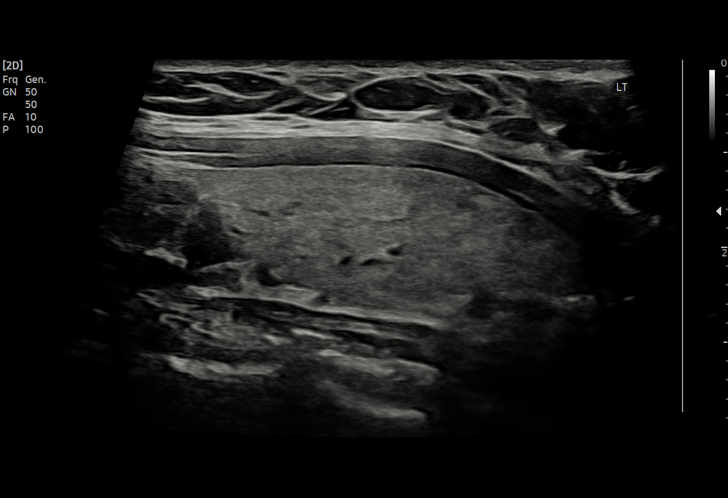
[im 33/53]
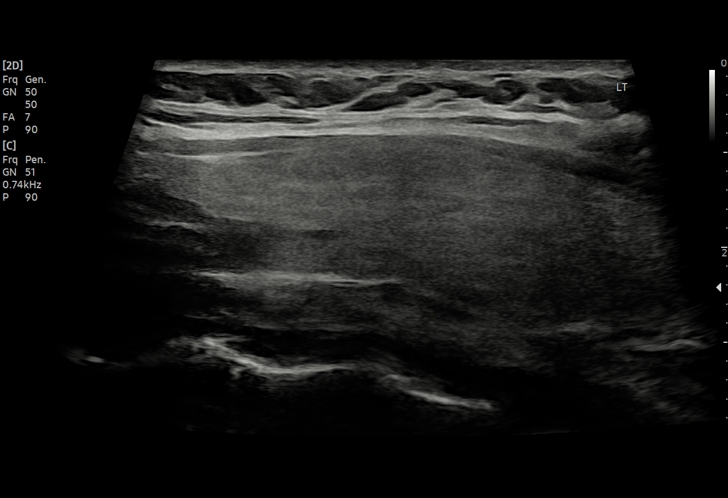
[im 35/53]
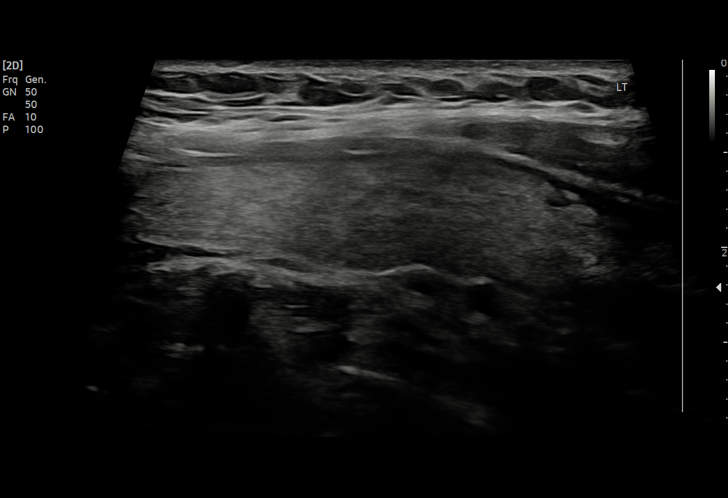
[im 40/53]
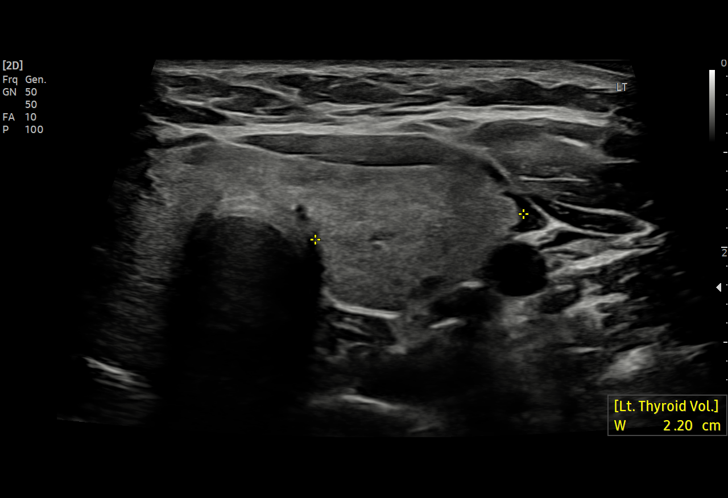
[im 44/53]
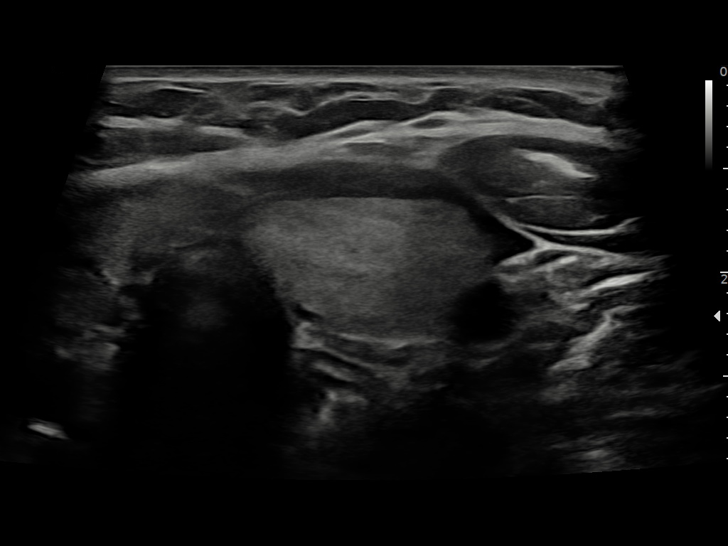
[im 48/53]
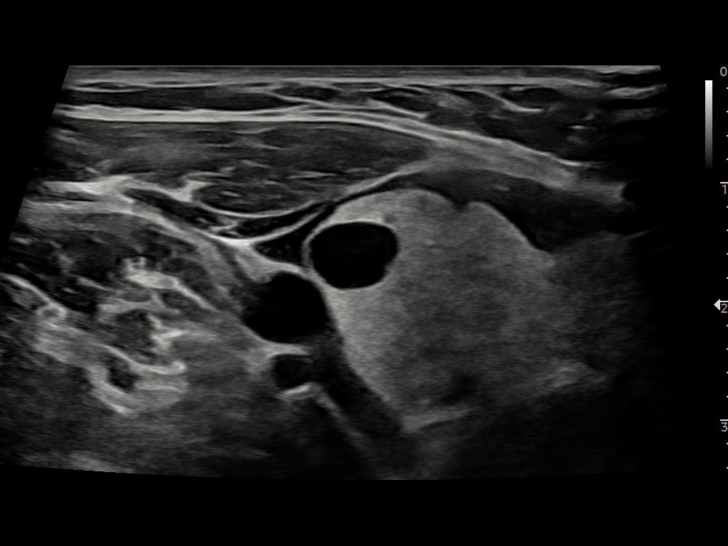
[im 53/53]
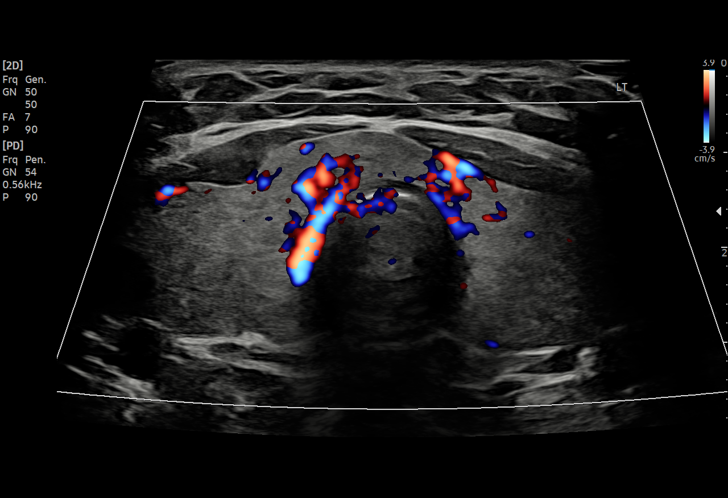

[14 of 25 positions shown; findings below may reference images not displayed]

FINDINGS: Parenchymal Echotexture: Mildly heterogenous

Isthmus: Normal in size measuring 0.5 cm in diameter

Right lobe: Normal in size measuring 5.2 x 1.5 x 2.2 cm

Left lobe: Normal in size measuring 5.6 x 1.9 x 2.2 cm

_________________________________________________________

Estimated total number of nodules >/= 1 cm: 1

Number of spongiform nodules >/=  2 cm not described below (TR1): 0

Number of mixed cystic and solid nodules >/= 1.5 cm not described
below (TR2): 0

_________________________________________________________

Nodule # 1:

Location: Right; Superior

Maximum size: 1.1 cm; Other 2 dimensions: 0.9 x 0.5 cm

Composition: solid/almost completely solid (2)

Echogenicity: hypoechoic (2)

Shape: not taller-than-wide (0)

Margins: smooth (0)

Echogenic foci: none (0)

ACR TI-RADS total points: 4.

ACR TI-RADS risk category: TR4 (4-6 points).

ACR TI-RADS recommendations:

*Given size (>/= 1 - 1.4 cm) and appearance, a follow-up ultrasound
in 1 year should be considered based on TI-RADS criteria.

_________________________________________________________

There is an approximately 0.7 x 0.6 x 0.4 cm partially solid though
predominantly cystic nodule within the mid aspect the right lobe of
the thyroid labeled 2), which does not meet criteria to recommend
percutaneous sampling or continued dedicated follow-up.

There is an approximately 1.2 x 0.8 x 0.6 cm anechoic cyst within
the inferior pole the right lobe thyroid (labeled 3).
IMPRESSION: 1. Mildly heterogeneous appearing but normal sized thyroid gland.
2. Nodule #1 meets imaging criteria to recommend a 1 year follow-up.

The above is in keeping with the ACR TI-RADS recommendations - [HOSPITAL] 3796;[DATE].

## 2022-06-02 NOTE — Addendum Note (Signed)
Encounter addended by: Annie Paras on: 06/02/2022 1:15 PM  Actions taken: Letter saved

## 2022-08-03 ENCOUNTER — Ambulatory Visit
Admission: EM | Admit: 2022-08-03 | Discharge: 2022-08-03 | Disposition: A | Discharge status: Home / Self Care | Payer: BC Managed Care – PPO | Attending: Family Medicine | Admitting: Family Medicine

## 2022-08-03 DIAGNOSIS — N3001 Acute cystitis with hematuria: Secondary | ICD-10-CM | POA: Diagnosis present

## 2022-08-03 LAB — POCT URINALYSIS DIP (MANUAL ENTRY)
Bilirubin, UA: NEGATIVE
Glucose, UA: 100 mg/dL — AB
Ketones, POC UA: NEGATIVE mg/dL
Nitrite, UA: NEGATIVE
Protein Ur, POC: 100 mg/dL — AB
Spec Grav, UA: 1.01 (ref 1.010–1.025)
Urobilinogen, UA: 0.2 E.U./dL
pH, UA: 6 (ref 5.0–8.0)

## 2022-08-03 LAB — POCT URINE PREGNANCY: Preg Test, Ur: NEGATIVE

## 2022-08-03 MED ORDER — NITROFURANTOIN MONOHYD MACRO 100 MG PO CAPS: 100.0000 mg | ORAL_CAPSULE | Freq: Two times a day (BID) | ORAL | 0 refills | Status: AC

## 2022-08-03 NOTE — ED Triage Notes (Signed)
Pt presents with c/o back pain and body aches , concerned for kidney infection

## 2022-08-03 NOTE — ED Provider Notes (Signed)
[facility]    CSN: [id number] Arrival date & time: 08/03/22  1400      History   Chief Complaint Chief Complaint  Patient presents with   Back Pain    HPI Ann Schultz is a 26 y.o. female.   HPI Ann Schultz is a 26 y.o. female presents for evaluation of urinary frequency, urgency and dysuria x 2 days, with low back pain without flank pain, fever, chills, or abnormal vaginal discharge or bleeding. Previous history of UTI and endorses poor oral intake of water. Uncertain of prior urine pathology. Patient's last menstrual period was 07/20/2022.  Past Medical History:  Diagnosis Date   Allergy    Breast fibroadenoma    right    Patient Active Problem List   Diagnosis Date Noted   Encounter for induction of labor 06/13/2021   Term pregnancy delivered 06/13/2021   Breast mass, right, probable fibroadenoma 11/10/2011    Past Surgical History:  Procedure Laterality Date   CYST EXCISION Right December 2012   EXCISION OF BREAST BIOPSY Left 05/31/2015   Procedure: LEFT EXCISIONAL BREAST BIOPSY;  Surgeon: Stark Klein, MD;  Location: [facility];  Service: General;  Laterality: Left;    OB History     Gravida  2   Para  2   Term  1   Preterm  1   AB      Living  2      SAB      IAB      Ectopic      Multiple  0   Live Births  2            Home Medications    Prior to Admission medications   Medication Sig Start Date End Date Taking? Authorizing Provider  nitrofurantoin, macrocrystal-monohydrate, (MACROBID) 100 MG capsule Take 1 capsule (100 mg total) by mouth 2 (two) times daily. 08/03/22  Yes Scot Jun, FNP  cyclobenzaprine (FLEXERIL) 10 MG tablet Take 1 tablet (10 mg total) by mouth 2 (two) times daily as needed for muscle spasms. 01/28/22   Hazel Sams, PA-C  famotidine (PEPCID) 20 MG tablet Take 20 mg by mouth daily.    [provider]  Prenatal Vit-Fe Fumarate-FA (PRENATAL MULTIVITAMIN) TABS tablet Take 1 tablet by  mouth daily at 12 noon.    [provider]    Family History Family History  Problem Relation Age of Onset   Cancer Maternal Grandmother        breast    Social History Social History   Tobacco Use   Smoking status: Never   Smokeless tobacco: Never  Vaping Use   Vaping Use: Never used  Substance Use Topics   Alcohol use: No   Drug use: No     Allergies   Patient has no known allergies.   Review of Systems Review of Systems Pertinent negatives listed in HPI   Physical Exam Triage Vital Signs ED Triage Vitals  Enc Vitals Group     BP 08/03/22 1453 123/80     Pulse Rate 08/03/22 1453 (!) 102     Resp 08/03/22 1453 16     Temp 08/03/22 1453 97.7 F (36.5 C)     Temp Source 08/03/22 1453 Temporal     SpO2 08/03/22 1453 97 %     Weight --      Height --      Head Circumference --      Peak Flow --  Pain Score 08/03/22 1456 10     Pain Loc --      Pain Edu? --      Excl. in [place]? --    No data found.  Updated Vital Signs BP 123/80 (BP Location: Left Arm)   Pulse (!) 102   Temp 97.7 F (36.5 C) (Temporal)   Resp 16   LMP 07/20/2022   SpO2 97%   Visual Acuity Right Eye Distance:   Left Eye Distance:   Bilateral Distance:    Right Eye Near:   Left Eye Near:    Bilateral Near:     Physical Exam General appearance: alert, well developed, well nourished, cooperative and in no distress Head: Normocephalic, without obvious abnormality, atraumatic Respiratory: Respirations even and unlabored, normal respiratory rate Heart: rate and rhythm normal.   CVA:  no flank pain Extremities: No gross deformities Skin: Skin color, texture, turgor normal. No rashes seen  Psych: Appropriate mood and affect.   UC Treatments / Results  Labs (all labs ordered are listed, but only abnormal results are displayed) Labs Reviewed  POCT URINALYSIS DIP (MANUAL ENTRY) - Abnormal; Notable for the following components:      Result Value   Clarity, UA cloudy (*)     Glucose, UA =100 (*)    Blood, UA small (*)    Protein Ur, POC =100 (*)    Leukocytes, UA Trace (*)    All other components within normal limits  URINE CULTURE  POCT URINE PREGNANCY    EKG   Radiology No results found.  Procedures Procedures (including critical care time)  Medications Ordered in UC Medications - No data to display  Initial Impression / Assessment and Plan / UC Course  I have reviewed the triage vital signs and the nursing notes.  Pertinent labs & imaging results that were available during my care of the patient were reviewed by me and considered in my medical decision making (see chart for details).     UA abnormal and findings consistent with UTI. Empiric antibiotic treatment initiated.  Encouraged increase intake of water. Recommended cranberry tablets, wiping from front to back, and urinating prior to and after cortis to reduce risk for reinfection. Urine culture pending.  ER if symptoms become severe. Follow-up with PCP if symptoms do not completely resolve.  Final Clinical Impressions(s) / UC Diagnoses   Final diagnoses:  Acute cystitis with hematuria     Discharge Instructions      Your urine culture will result within 3 days.  We will notify you if there is any changes in treatment based on urine culture results.  If you do not hear anything from our clinic continue with current treatment as prescribed.  Hydrate well with fluids.  If you develop any fever, nausea or vomiting or severe abdominal pain go immediately to the emergency department.      ED Prescriptions     Medication Sig Dispense Auth. Provider   nitrofurantoin, macrocrystal-monohydrate, (MACROBID) 100 MG capsule Take 1 capsule (100 mg total) by mouth 2 (two) times daily. 10 capsule Scot Jun, FNP      PDMP not reviewed this encounter.   Scot Jun, FNP 08/03/22 [phone]

## 2022-08-03 NOTE — Discharge Instructions (Addendum)
Your urine culture will result within 3 days.  We will notify you if there is any changes in treatment based on urine culture results.  If you do not hear anything from our clinic continue with current treatment as prescribed.  Hydrate well with fluids.  If you develop any fever, nausea or vomiting or severe abdominal pain go immediately to the emergency department.

## 2022-08-04 ENCOUNTER — Ambulatory Visit: Payer: Medicaid Other

## 2022-08-05 LAB — URINE CULTURE
Culture: 30000 — AB
Special Requests: NORMAL

## 2022-09-02 ENCOUNTER — Ambulatory Visit
Admission: RE | Admit: 2022-09-02 | Discharge: 2022-09-02 | Disposition: A | Discharge status: Home / Self Care | Payer: Medicaid Other | Source: Ambulatory Visit | Attending: Emergency Medicine | Admitting: Emergency Medicine

## 2022-09-02 VITALS — BP 103/76 | HR 96 | Temp 97.9°F | Resp 18 | Ht 67.0 in

## 2022-09-02 DIAGNOSIS — A084 Viral intestinal infection, unspecified: Secondary | ICD-10-CM | POA: Diagnosis not present

## 2022-09-02 DIAGNOSIS — N3000 Acute cystitis without hematuria: Secondary | ICD-10-CM | POA: Insufficient documentation

## 2022-09-02 LAB — POCT URINALYSIS DIP (MANUAL ENTRY)
Bilirubin, UA: NEGATIVE
Glucose, UA: NEGATIVE mg/dL
Ketones, POC UA: NEGATIVE mg/dL
Nitrite, UA: NEGATIVE
Protein Ur, POC: 30 mg/dL — AB
Spec Grav, UA: 1.015 (ref 1.010–1.025)
Urobilinogen, UA: 0.2 E.U./dL
pH, UA: 6.5 (ref 5.0–8.0)

## 2022-09-02 LAB — POCT URINE PREGNANCY: Preg Test, Ur: NEGATIVE

## 2022-09-02 MED ORDER — SULFAMETHOXAZOLE-TRIMETHOPRIM 800-160 MG PO TABS: 1.0000 | ORAL_TABLET | Freq: Two times a day (BID) | ORAL | 0 refills | Status: AC

## 2022-09-02 NOTE — ED Triage Notes (Signed)
Patient to Urgent Care with complaints of dysuria that started last night, reports back ache and body aches. States that her urine is dark/ cloudy/ malodorous.   Also reports onset of diarrhea this morning.

## 2022-09-02 NOTE — Discharge Instructions (Signed)
Follow up with your primary care provider if your symptoms are not improving.     

## 2022-09-02 NOTE — ED Provider Notes (Signed)
[place]    ASSESSMENT & PLAN:  1. Acute cystitis without hematuria   2. Viral intestinal infection    UA positive only for few leukocytes. Likely concurrent infection given other acute symptoms.  Will treat for presumed acute cystitis however likely concurrent viral infection causing other acute symptoms.  Recently treated for UTI with Macrobid so will order Bactrim this time.  Will send for culture to verify susceptibility.  Recommended patient drink adequate fluids, especially water to prevent dehydration.  Food as tolerated.  Recommended rest while acute symptoms resolve.  Work note provided.   Meds ordered this encounter  Medications   sulfamethoxazole-trimethoprim (BACTRIM DS) 800-160 MG tablet    Sig: Take 1 tablet by mouth 2 (two) times daily for 3 days.    Dispense:  6 tablet    Refill:  0    Order Specific Question:   Supervising Provider    Answer:   Chase Picket [id number]    Urine culture sent. Will notify patient when results available.  Will follow up with her PCP or here if not showing improvement over the next 48 hours, sooner if needed.  Outlined signs and symptoms indicating need for more acute intervention. Patient verbalized understanding.  After Visit Summary given.  SUBJECTIVE:  Ann Schultz is a 26 y.o. female who complains of urinary frequency, urgency and dysuria for the past 1 day. Endorses associated back pain and generalized myalgia, however denies flank pain, fever, chills, vaginal discharge or bleeding.   Gross hematuria: not present. Reports chills, myalgia, hot/cold, 1 episode of watery diarrhea. No specific aggravating or alleviating factors reported. No LE edema. Normal PO intake with diarrhea this morning. Denies n/v. Without specific abdominal pain. Ambulatory without difficulty. OTC treatment: none. H/O UTI: yes.  LMP: Patient's last menstrual period was 08/18/2022.  ROS: As in HPI.  OBJECTIVE:  Vitals:    09/02/22 0939 09/02/22 0947  BP:  103/76  Pulse:  96  Resp:  18  Temp:  97.9 F (36.6 C)  SpO2:  98%  Height: '5\' 7"'$  (1.702 m)    General appearance: alert; no distress HENT: oropharynx: normal Lungs: unlabored respirations Abdomen: soft, non-tender; bowel sounds normal; no masses or organomegaly; no guarding or rebound tenderness Back: no CVA tenderness Extremities: no edema; symmetrical with no gross deformities Skin: warm and dry Neurologic: normal gait Psychological: alert and cooperative; normal mood and affect  Labs Reviewed  POCT URINALYSIS DIP (MANUAL ENTRY) - Abnormal; Notable for the following components:      Result Value   Clarity, UA cloudy (*)    Blood, UA trace-intact (*)    Protein Ur, POC =30 (*)    Leukocytes, UA Small (1+) (*)    All other components within normal limits  URINE CULTURE  POCT URINE PREGNANCY    No Known Allergies  Past Medical History:  Diagnosis Date   Allergy    Breast fibroadenoma    right   Social History   Socioeconomic History   Marital status: Single    Spouse name: Not on file   Number of children: Not on file   Years of education: Not on file   Highest education level: Not on file  Occupational History   Not on file  Tobacco Use   Smoking status: Never   Smokeless tobacco: Never  Vaping Use   Vaping Use: Never used  Substance and Sexual Activity   Alcohol use: No   Drug use: No   Sexual  activity: Not Currently  Other Topics Concern   Not on file  Social History Narrative   Not on file   Social Determinants of Health   Financial Resource Strain: Not on file  Food Insecurity: Not on file  Transportation Needs: Not on file  Physical Activity: Not on file  Stress: Not on file  Social Connections: Not on file  Intimate Partner Violence: Not on file   Family History  Problem Relation Age of Onset   Cancer Maternal Grandmother        breast        Rose [place], [place] 09/02/22 1028

## 2022-09-04 LAB — URINE CULTURE: Culture: 60000 — AB

## 2024-04-26 ENCOUNTER — Ambulatory Visit
Admission: EM | Admit: 2024-04-26 | Discharge: 2024-04-26 | Disposition: A | Discharge status: Home / Self Care | Attending: Family Medicine | Admitting: Family Medicine

## 2024-04-26 ENCOUNTER — Encounter: Payer: Self-pay | Admitting: Emergency Medicine

## 2024-04-26 DIAGNOSIS — J309 Allergic rhinitis, unspecified: Secondary | ICD-10-CM

## 2024-04-26 DIAGNOSIS — R0982 Postnasal drip: Secondary | ICD-10-CM | POA: Diagnosis not present

## 2024-04-26 LAB — POC SARS CORONAVIRUS 2 AG -  ED: SARS Coronavirus 2 Ag: NEGATIVE

## 2024-04-26 MED ORDER — FLUTICASONE PROPIONATE 50 MCG/ACT NA SUSP: 1.0000 | Freq: Two times a day (BID) | NASAL | 2 refills | Status: AC | PRN

## 2024-04-26 MED ORDER — PROMETHAZINE-DM 6.25-15 MG/5ML PO SYRP: 5.0000 mL | ORAL_SOLUTION | Freq: Three times a day (TID) | ORAL | 0 refills | Status: AC | PRN

## 2024-04-26 NOTE — ED Provider Notes (Signed)
 [facility]    CSN: [id number] Arrival date & time: 04/26/24  0955      History   Chief Complaint Chief Complaint  Patient presents with   Nasal Congestion   Generalized Body Aches   Cough    HPI Ann Schultz is a 28 y.o. female.    Cough Patient here with a 3-day history of cough, nasal congestion, drainage and generalized bodyaches.  No fever.  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  Past Medical History:  Diagnosis Date   Allergy    Breast fibroadenoma    right    Patient Active Problem List   Diagnosis Date Noted   Encounter for induction of labor 06/13/2021   Term pregnancy delivered 06/13/2021   Breast mass, right, probable fibroadenoma 11/10/2011    Past Surgical History:  Procedure Laterality Date   CYST EXCISION Right December 2012   EXCISION OF BREAST BIOPSY Left 05/31/2015   Procedure: LEFT EXCISIONAL BREAST BIOPSY;  Surgeon: Lockie Rima, MD;  Location: [facility];  Service: General;  Laterality: Left;    OB History     Gravida  2   Para  2   Term  1   Preterm  1   AB      Living  2      SAB      IAB      Ectopic      Multiple  0   Live Births  2            Home Medications    Prior to Admission medications   Medication Sig Start Date End Date Taking? Authorizing Provider  fluticasone (FLONASE) 50 MCG/ACT nasal spray Place 1 spray into both nostrils 2 (two) times daily as needed for allergies or rhinitis (congestion). 04/26/24  Yes Buena Carmine, NP  promethazine -dextromethorphan (PROMETHAZINE -DM) 6.25-15 MG/5ML syrup Take 5 mLs by mouth 3 (three) times daily as needed for cough. 04/26/24  Yes Buena Carmine, NP  nitrofurantoin , macrocrystal-monohydrate, (MACROBID ) 100 MG capsule Take 1 capsule (100 mg total) by mouth 2 (two) times daily. 08/03/22   Buena Carmine, NP  Prenatal Vit-Fe Fumarate-FA (PRENATAL MULTIVITAMIN) TABS tablet Take 1  tablet by mouth daily at 12 noon.    [provider]    Family History Family History  Problem Relation Age of Onset   Cancer Maternal Grandmother        breast    Social History Social History   Tobacco Use   Smoking status: Never    Passive exposure: Never   Smokeless tobacco: Never  Vaping Use   Vaping status: Never Used  Substance Use Topics   Alcohol use: No   Drug use: No     Allergies   Patient has no known allergies.   Review of Systems Review of Systems  Respiratory:  Positive for cough.      Physical Exam Triage Vital Signs ED Triage Vitals  Encounter Vitals Group     BP 04/26/24 1200 110/74     Systolic BP Percentile --      Diastolic BP Percentile --      Pulse Rate 04/26/24 1200 85     Resp 04/26/24 1200 16     Temp 04/26/24 1200 98.2 F (36.8 C)     Temp Source 04/26/24 1200 Oral     SpO2 04/26/24 1200 97 %     Weight 04/26/24  1159 236 lb 15.9 oz (107.5 kg)     Height --      Head Circumference --      Peak Flow --      Pain Score 04/26/24 1158 4     Pain Loc --      Pain Education --      Exclude from Growth Chart --    No data found.  Updated Vital Signs BP 110/74 (BP Location: Left Arm)   Pulse 85   Temp 98.2 F (36.8 C) (Oral)   Resp 16   Wt 236 lb 15.9 oz (107.5 kg)   LMP 04/24/2024 (Exact Date)   SpO2 97%   Breastfeeding No   BMI 37.12 kg/m   Visual Acuity Right Eye Distance:   Left Eye Distance:   Bilateral Distance:    Right Eye Near:   Left Eye Near:    Bilateral Near:     Physical Exam Vitals reviewed.  Constitutional:      Appearance: Normal appearance.  HENT:     Head: Normocephalic and atraumatic.     Nose: Congestion and rhinorrhea present.     Mouth/Throat:     Pharynx: Posterior oropharyngeal erythema present. No oropharyngeal exudate.  Eyes:     Extraocular Movements: Extraocular movements intact.     Pupils: Pupils are equal, round, and reactive to light.  Cardiovascular:     Rate  and Rhythm: Normal rate and regular rhythm.  Pulmonary:     Effort: Pulmonary effort is normal.     Breath sounds: Normal breath sounds.  Musculoskeletal:     Cervical back: Normal range of motion and neck supple.  Skin:    General: Skin is warm and dry.  Neurological:     General: No focal deficit present.     Mental Status: She is alert and oriented to person, place, and time.      UC Treatments / Results  Labs (all labs ordered are listed, but only abnormal results are displayed) Labs Reviewed  POC SARS CORONAVIRUS 2 AG -  ED    EKG   Radiology No results found.  Procedures Procedures (including critical care time)  Medications Ordered in UC Medications - No data to display  Initial Impression / Assessment and Plan / UC Course  I have reviewed the triage vital signs and the nursing notes.  Pertinent labs & imaging results that were available during my care of the patient were reviewed by me and considered in my medical decision making (see chart for details).    Symptoms appear to be viral versus seasonal allergy related.  Treatment remains symptom management only.  Treatment per discharge medication orders.  Patient advised to return if symptoms worsen or do not improve with treatment. Final Clinical Impressions(s) / UC Diagnoses   Final diagnoses:  Acute allergic rhinitis  Post-nasal drainage     Discharge Instructions      Appear viral, symptom management indicated only.  Hydrate well with fluids.  Take medications as prescribed.  If symptoms return or have not improved within 7 days return for reevaluation.     ED Prescriptions     Medication Sig Dispense Auth. Provider   fluticasone (FLONASE) 50 MCG/ACT nasal spray Place 1 spray into both nostrils 2 (two) times daily as needed for allergies or rhinitis (congestion). 11.1 mL Buena Carmine, NP   promethazine -dextromethorphan (PROMETHAZINE -DM) 6.25-15 MG/5ML syrup Take 5 mLs by mouth 3 (three)  times daily as needed for cough. 180 mL  Buena Carmine, NP      PDMP not reviewed this encounter.   Buena Carmine, NP 04/26/24 [phone]

## 2024-04-26 NOTE — Discharge Instructions (Signed)
 Appear viral, symptom management indicated only.  Hydrate well with fluids.  Take medications as prescribed.  If symptoms return or have not improved within 7 days return for reevaluation.

## 2024-04-26 NOTE — ED Triage Notes (Signed)
 Pt c/o cough, nasal congestion, and body aches x 3 days. Pt denies emesis and diarrhea.

## 2024-10-26 ENCOUNTER — Ambulatory Visit (HOSPITAL_COMMUNITY)

## 2024-10-26 ENCOUNTER — Emergency Department (HOSPITAL_BASED_OUTPATIENT_CLINIC_OR_DEPARTMENT_OTHER): Admitting: Radiology

## 2024-10-26 ENCOUNTER — Other Ambulatory Visit: Payer: Self-pay

## 2024-10-26 ENCOUNTER — Encounter

## 2024-10-26 ENCOUNTER — Encounter (HOSPITAL_BASED_OUTPATIENT_CLINIC_OR_DEPARTMENT_OTHER): Payer: Self-pay | Admitting: Emergency Medicine

## 2024-10-26 ENCOUNTER — Emergency Department (HOSPITAL_BASED_OUTPATIENT_CLINIC_OR_DEPARTMENT_OTHER)
Admission: EM | Admit: 2024-10-26 | Discharge: 2024-10-26 | Disposition: A | Discharge status: Home / Self Care | Attending: Emergency Medicine | Admitting: Emergency Medicine

## 2024-10-26 ENCOUNTER — Telehealth: Admitting: Physician Assistant

## 2024-10-26 DIAGNOSIS — R072 Precordial pain: Secondary | ICD-10-CM | POA: Diagnosis present

## 2024-10-26 DIAGNOSIS — R0789 Other chest pain: Secondary | ICD-10-CM | POA: Insufficient documentation

## 2024-10-26 DIAGNOSIS — M549 Dorsalgia, unspecified: Secondary | ICD-10-CM

## 2024-10-26 LAB — CBC
HCT: 34.4 % — ABNORMAL LOW (ref 36.0–46.0)
Hemoglobin: 11.6 g/dL — ABNORMAL LOW (ref 12.0–15.0)
MCH: 25.1 pg — ABNORMAL LOW (ref 26.0–34.0)
MCHC: 33.7 g/dL (ref 30.0–36.0)
MCV: 74.5 fL — ABNORMAL LOW (ref 80.0–100.0)
Platelets: 313 K/uL (ref 150–400)
RBC: 4.62 MIL/uL (ref 3.87–5.11)
RDW: 14.9 % (ref 11.5–15.5)
WBC: 7.1 K/uL (ref 4.0–10.5)
nRBC: 0 % (ref 0.0–0.2)

## 2024-10-26 LAB — PREGNANCY, URINE: Preg Test, Ur: NEGATIVE

## 2024-10-26 LAB — BASIC METABOLIC PANEL WITH GFR
Anion gap: 11 (ref 5–15)
BUN: 7 mg/dL (ref 6–20)
CO2: 23 mmol/L (ref 22–32)
Calcium: 9.4 mg/dL (ref 8.9–10.3)
Chloride: 104 mmol/L (ref 98–111)
Creatinine, Ser: 0.7 mg/dL (ref 0.44–1.00)
GFR, Estimated: >60 mL/min (ref >60)
Glucose, Bld: 105 mg/dL — ABNORMAL HIGH (ref 70–99)
Potassium: 3.6 mmol/L (ref 3.5–5.1)
Sodium: 139 mmol/L (ref 135–145)

## 2024-10-26 LAB — TROPONIN T, HIGH SENSITIVITY: Troponin T High Sensitivity: <15 ng/L (ref 0–19)

## 2024-10-26 MED ORDER — KETOROLAC TROMETHAMINE 15 MG/ML IJ SOLN
15.0000 mg | Freq: Once | INTRAMUSCULAR | Status: AC
  Administered 2024-10-26: 15 mg via INTRAMUSCULAR
  Filled 2024-10-26: qty 1

## 2024-10-26 NOTE — ED Triage Notes (Signed)
 Report chest pain x 4 weeks Right side Worse with movement  Also reports left side back pain

## 2024-10-26 NOTE — ED Provider Notes (Signed)
 [facility] AT [facility] Provider Note   CSN: [id number] Arrival date & time: 10/26/24  1525     Patient presents with: Chest Pain   Ann Schultz is a 28 y.o. female.   28 year old female previously healthy presents emergency department chest pain.  Patient reports that 4 weeks ago she had an episode of substernal chest pain that was worsened with movement.  Says that 6 days ago it recurred.  Reaches far distances at her work and thinks that it might be related to that.  Worsened with movement of her upper body.  Not exertional.  Not pleuritic.  No shortness of breath or cough.  No rashes on her chest wall.  No history of cardiovascular disease.  Not on OCPs or hormones.  No recent surgeries.  No history of cancer.  No history of DVT or PE.  No leg swelling or hemoptysis.       Prior to Admission medications   Medication Sig Start Date End Date Taking? Authorizing Provider  fluticasone  (FLONASE ) 50 MCG/ACT nasal spray Place 1 spray into both nostrils 2 (two) times daily as needed for allergies or rhinitis (congestion). 04/26/24   Arloa Suzen RAMAN, NP  nitrofurantoin , macrocrystal-monohydrate, (MACROBID ) 100 MG capsule Take 1 capsule (100 mg total) by mouth 2 (two) times daily. 08/03/22   Arloa Suzen RAMAN, NP  Prenatal Vit-Fe Fumarate-FA (PRENATAL MULTIVITAMIN) TABS tablet Take 1 tablet by mouth daily at 12 noon.    [provider]  promethazine -dextromethorphan (PROMETHAZINE -DM) 6.25-15 MG/5ML syrup Take 5 mLs by mouth 3 (three) times daily as needed for cough. 04/26/24   Arloa Suzen RAMAN, NP    Allergies: Patient has no known allergies.    Review of Systems  Updated Vital Signs BP 117/88   Pulse 81   Temp 97.9 F (36.6 C) (Oral)   Resp (!) 21   LMP 09/30/2024 (Approximate)   SpO2 100%   Physical Exam Vitals and nursing note reviewed.  Constitutional:      General: She is not in acute distress.    Appearance: She is  well-developed.  HENT:     Head: Normocephalic and atraumatic.     Right Ear: External ear normal.     Left Ear: External ear normal.     Nose: Nose normal.  Eyes:     Extraocular Movements: Extraocular movements intact.     Conjunctiva/sclera: Conjunctivae normal.     Pupils: Pupils are equal, round, and reactive to light.  Cardiovascular:     Rate and Rhythm: Normal rate and regular rhythm.     Heart sounds: No murmur heard.    Comments: Radial pulses 2+ bilaterally.  Chest pain not reproducible.  No overlying rashes Pulmonary:     Effort: Pulmonary effort is normal. No respiratory distress.     Breath sounds: Normal breath sounds.  Musculoskeletal:     Cervical back: Normal range of motion and neck supple.     Right lower leg: No edema.     Left lower leg: No edema.  Skin:    General: Skin is warm and dry.  Neurological:     Mental Status: She is alert and oriented to person, place, and time. Mental status is at baseline.  Psychiatric:        Mood and Affect: Mood normal.     (all labs ordered are listed, but only abnormal results are displayed) Labs Reviewed  BASIC METABOLIC PANEL WITH GFR - Abnormal; Notable for the following components:  Result Value   Glucose, Bld 105 (*)    All other components within normal limits  CBC - Abnormal; Notable for the following components:   Hemoglobin 11.6 (*)    HCT 34.4 (*)    MCV 74.5 (*)    MCH 25.1 (*)    All other components within normal limits  PREGNANCY, URINE  TROPONIN T, HIGH SENSITIVITY    EKG: EKG Interpretation Date/Time:  Wednesday October 26 2024 15:34:37 EDT Ventricular Rate:  81 PR Interval:  168 QRS Duration:  104 QT Interval:  382 QTC Calculation: 444 R Axis:   21  Text Interpretation: Sinus rhythm Confirmed by Yolande Charleston [id number]) on 10/26/2024 4:29:04 PM  Radiology: DG Chest 2 View Result Date: 10/26/2024 CLINICAL DATA:  Chest pain x4 weeks. EXAM: CHEST - 2 VIEW COMPARISON:  January 13, 2018 FINDINGS: The heart size and mediastinal contours are within normal limits. Both lungs are clear. The visualized skeletal structures are unremarkable. IMPRESSION: No active cardiopulmonary disease. Electronically Signed   By: Suzen Dials M.D.   On: 10/26/2024 16:39     Procedures   Medications Ordered in the ED  ketorolac (TORADOL) 15 MG/ML injection 15 mg (15 mg Intramuscular Given 10/26/24 1703)                                    Medical Decision Making Amount and/or Complexity of Data Reviewed Labs: ordered. Radiology: ordered.  Risk Prescription drug management.   Ann Schultz is a 28 y.o. female previously healthy presents emergency department chest pain  Initial Ddx:  MI, PE, pneumonia, dissection, pericarditis, costochondritis, reflux  MDM:  Patient presents emergency department with chest pain that peers to worsen with moving her upper body.  Feels like she aggravated it when she was reaching for objects at work.  With the patient's chest discomfort will obtain EKG and troponins to evaluate for MI though given the patient's age and risk factors is very low risk.  Also considering pulmonary embolism but patient is not high risk and is PERC negative so has been clinically ruled out.  Considered dissection but with their symmetric pulses, history, and description of the pain feel it is less likely.  If chest x-ray reveals widened mediastinum or any other concerning findings will consider CTA.  Also considered pericarditis but description is unlikely and they do not have risk factors for this diagnosis.  Chest pain not reproducible so feel it costochondritis less likely.  No infectious symptoms to suggest pneumonia at this time that would be causing pleuritic chest pain.  Plan:  Labs Troponin EKG Chest x-ray Toradol shot  ED Summary/Re-evaluation:  EKG showed sinus rhythm without ischemic changes.  High-sensitivity troponin undetectably low.  Given the duration  of her symptoms do not feel repeat is warranted.  Chest x-ray without widened mediastinum or other abnormality.  Was feeling better after the Toradol.  Suspect that she has a muscle strain.  Will have her follow-up with her primary doctor in several days  This patient presents to the ED for concern of complaints listed in HPI, this involves an extensive number of treatment options, and is a complaint that carries with it a high risk of complications and morbidity. Disposition including potential need for admission considered.   Dispo: DC Home. Return precautions discussed including, but not limited to, those listed in the AVS. Allowed pt time to ask questions which were answered  fully prior to dc.  Records reviewed Outpatient Clinic Notes The following labs were independently interpreted: Chemistry and show no acute abnormality I independently reviewed the following imaging with scope of interpretation limited to determining acute life threatening conditions related to emergency care: Chest x-ray and agree with the radiologist interpretation with the following exceptions: none I personally reviewed and interpreted cardiac monitoring: normal sinus rhythm  I personally reviewed and interpreted the pt's EKG: see above for interpretation  I have reviewed the patients home medications and made adjustments as needed   Final diagnoses:  Chest wall pain    ED Discharge Orders     None          Yolande Lamar BROCKS, MD 10/26/24 1727

## 2024-10-26 NOTE — Progress Notes (Signed)
  Because of chronic back pain (> 4 weeks) and need for proper examination, I feel your condition warrants further evaluation and I recommend that you be seen in a face-to-face visit.   NOTE: There will be NO CHARGE for this E-Visit   If you are having a true medical emergency, please call 911.     For an urgent face to face visit, [place] has multiple urgent care centers for your convenience.  Click the link below for the full list of locations and hours, walk-in wait times, appointment scheduling options and driving directions:  Urgent Care - [place], [place], [place], [place], [place], [place]  [place]     Your MyChart E-visit questionnaire answers were reviewed by a board certified advanced clinical practitioner to complete your personal care plan based on your specific symptoms.    Thank you for using e-Visits.

## 2024-10-26 NOTE — Discharge Instructions (Signed)
 You were seen for your chest pain in the emergency department.  It is likely from muscle strain  At home, please take Tylenol  and ibuprofen  for your pain.    Follow-up with your primary doctor in 2-3 days regarding your visit.    Return immediately to the emergency department if you experience any of the following: Worsening pain, difficulty breathing, unexplained vomiting or sweating, or any other concerning symptoms.    Thank you for visiting our Emergency Department. It was a pleasure taking care of you today.
